# Patient Record
Sex: Female | Born: 1955
Health system: Southern US, Community
[De-identification: ages and names within clinical notes are randomized; demographics above are authoritative.]

## PROBLEM LIST (undated history)

## (undated) DIAGNOSIS — M810 Age-related osteoporosis without current pathological fracture: Secondary | ICD-10-CM

## (undated) DIAGNOSIS — E039 Hypothyroidism, unspecified: Secondary | ICD-10-CM

## (undated) DIAGNOSIS — B1920 Unspecified viral hepatitis C without hepatic coma: Secondary | ICD-10-CM

## (undated) DIAGNOSIS — L900 Lichen sclerosus et atrophicus: Secondary | ICD-10-CM

## (undated) DIAGNOSIS — R87619 Unspecified abnormal cytological findings in specimens from cervix uteri: Secondary | ICD-10-CM

## (undated) DIAGNOSIS — M25579 Pain in unspecified ankle and joints of unspecified foot: Secondary | ICD-10-CM

## (undated) DIAGNOSIS — O019 Hydatidiform mole, unspecified: Secondary | ICD-10-CM

## (undated) DIAGNOSIS — H9209 Otalgia, unspecified ear: Secondary | ICD-10-CM

## (undated) HISTORY — DX: Age-related osteoporosis without current pathological fracture: M81.0

## (undated) HISTORY — PX: DILATION AND CURETTAGE OF UTERUS: SHX78

## (undated) HISTORY — DX: Hydatidiform mole, unspecified: O01.9

## (undated) HISTORY — PX: EYE SURGERY: SHX253

## (undated) HISTORY — DX: Otalgia, unspecified ear: H92.09

## (undated) HISTORY — PX: TUBAL LIGATION: SHX77

## (undated) HISTORY — DX: Unspecified abnormal cytological findings in specimens from cervix uteri: R87.619

## (undated) HISTORY — DX: Unspecified viral hepatitis C without hepatic coma: B19.20

## (undated) HISTORY — DX: Pain in unspecified ankle and joints of unspecified foot: M25.579

## (undated) HISTORY — DX: Lichen sclerosus et atrophicus: L90.0

## (undated) HISTORY — DX: Hypothyroidism, unspecified: E03.9

## (undated) HISTORY — PX: CRYOTHERAPY: SHX1416

---
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
## 1998-09-11 DIAGNOSIS — E039 Hypothyroidism, unspecified: Secondary | ICD-10-CM | POA: Insufficient documentation

## 1999-05-23 ENCOUNTER — Other Ambulatory Visit: Admission: RE | Admit: 1999-05-23 | Discharge: 1999-05-23 | Payer: Self-pay | Admitting: Family Medicine

## 1999-12-09 ENCOUNTER — Other Ambulatory Visit: Admission: RE | Admit: 1999-12-09 | Discharge: 1999-12-09 | Payer: Self-pay

## 2001-01-30 ENCOUNTER — Other Ambulatory Visit: Admission: RE | Admit: 2001-01-30 | Discharge: 2001-01-30 | Payer: Self-pay | Admitting: Family Medicine

## 2003-09-12 DIAGNOSIS — M810 Age-related osteoporosis without current pathological fracture: Secondary | ICD-10-CM | POA: Insufficient documentation

## 2007-07-02 ENCOUNTER — Other Ambulatory Visit: Admission: RE | Admit: 2007-07-02 | Discharge: 2007-07-02 | Payer: Self-pay | Admitting: Obstetrics and Gynecology

## 2007-09-12 HISTORY — PX: COLONOSCOPY: SHX174

## 2007-09-25 ENCOUNTER — Ambulatory Visit (HOSPITAL_COMMUNITY): Admission: RE | Admit: 2007-09-25 | Discharge: 2007-09-25 | Payer: Self-pay | Admitting: Gastroenterology

## 2007-09-25 ENCOUNTER — Encounter (INDEPENDENT_AMBULATORY_CARE_PROVIDER_SITE_OTHER): Payer: Self-pay | Admitting: Gastroenterology

## 2008-01-30 ENCOUNTER — Ambulatory Visit: Payer: Self-pay | Admitting: Gastroenterology

## 2008-02-13 ENCOUNTER — Encounter (INDEPENDENT_AMBULATORY_CARE_PROVIDER_SITE_OTHER): Payer: Self-pay | Admitting: Interventional Radiology

## 2008-02-13 ENCOUNTER — Ambulatory Visit (HOSPITAL_COMMUNITY): Admission: RE | Admit: 2008-02-13 | Discharge: 2008-02-13 | Payer: Self-pay | Admitting: Gastroenterology

## 2008-07-16 ENCOUNTER — Other Ambulatory Visit: Admission: RE | Admit: 2008-07-16 | Discharge: 2008-07-16 | Payer: Self-pay | Admitting: Obstetrics and Gynecology

## 2010-01-07 ENCOUNTER — Ambulatory Visit (HOSPITAL_COMMUNITY): Admission: RE | Admit: 2010-01-07 | Discharge: 2010-01-07 | Payer: Self-pay | Admitting: Family Medicine

## 2010-03-30 ENCOUNTER — Ambulatory Visit: Payer: Self-pay | Admitting: Oncology

## 2010-04-05 ENCOUNTER — Ambulatory Visit (HOSPITAL_COMMUNITY): Admission: RE | Admit: 2010-04-05 | Discharge: 2010-04-05 | Payer: Self-pay | Admitting: Oncology

## 2010-04-05 LAB — CBC WITH DIFFERENTIAL/PLATELET
BASO%: 0.5 % (ref 0.0–2.0)
HCT: 38.7 % (ref 34.8–46.6)
LYMPH%: 27.7 % (ref 14.0–49.7)
MCHC: 34.7 g/dL (ref 31.5–36.0)
MCV: 88.2 fL (ref 79.5–101.0)
MONO#: 0.5 10*3/uL (ref 0.1–0.9)
MONO%: 7.5 % (ref 0.0–14.0)
NEUT%: 60 % (ref 38.4–76.8)
Platelets: 260 10*3/uL (ref 145–400)
RBC: 4.38 10*6/uL (ref 3.70–5.45)
WBC: 7 10*3/uL (ref 3.9–10.3)

## 2010-04-07 LAB — COMPREHENSIVE METABOLIC PANEL
Alkaline Phosphatase: 105 U/L (ref 39–117)
CO2: 24 mEq/L (ref 19–32)
Creatinine, Ser: 0.71 mg/dL (ref 0.40–1.20)
Glucose, Bld: 66 mg/dL — ABNORMAL LOW (ref 70–99)
Total Bilirubin: 0.7 mg/dL (ref 0.3–1.2)

## 2010-04-07 LAB — SPEP & IFE WITH QIG
Beta 2: 3.3 % (ref 3.2–6.5)
Beta Globulin: 5.3 % (ref 4.7–7.2)
Gamma Globulin: 22.2 % — ABNORMAL HIGH (ref 11.1–18.8)
IgA: 174 mg/dL (ref 68–378)
IgG (Immunoglobin G), Serum: 1960 mg/dL — ABNORMAL HIGH (ref 694–1618)
IgM, Serum: 148 mg/dL (ref 60–263)
M-Spike, %: 0.71 g/dL

## 2010-04-07 LAB — KAPPA/LAMBDA LIGHT CHAINS
Kappa free light chain: 1.88 mg/dL (ref 0.33–1.94)
Lambda Free Lght Chn: 2.33 mg/dL (ref 0.57–2.63)

## 2010-05-05 ENCOUNTER — Ambulatory Visit: Payer: Self-pay | Admitting: Gastroenterology

## 2010-05-25 ENCOUNTER — Ambulatory Visit (HOSPITAL_COMMUNITY): Admission: RE | Admit: 2010-05-25 | Discharge: 2010-05-25 | Payer: Self-pay | Admitting: Gastroenterology

## 2010-09-19 ENCOUNTER — Ambulatory Visit: Payer: Self-pay | Admitting: Oncology

## 2010-09-21 LAB — COMPREHENSIVE METABOLIC PANEL
ALT: 34 U/L (ref 0–35)
AST: 33 U/L (ref 0–37)
Albumin: 4.3 g/dL (ref 3.5–5.2)
Alkaline Phosphatase: 123 U/L — ABNORMAL HIGH (ref 39–117)
BUN: 19 mg/dL (ref 6–23)
CO2: 31 mEq/L (ref 19–32)
Calcium: 9.6 mg/dL (ref 8.4–10.5)
Chloride: 100 mEq/L (ref 96–112)
Creatinine, Ser: 1.05 mg/dL (ref 0.40–1.20)
Glucose, Bld: 86 mg/dL (ref 70–99)
Potassium: 3.6 mEq/L (ref 3.5–5.3)
Sodium: 140 mEq/L (ref 135–145)
Total Bilirubin: 0.8 mg/dL (ref 0.3–1.2)
Total Protein: 8 g/dL (ref 6.0–8.3)

## 2010-09-21 LAB — CBC WITH DIFFERENTIAL/PLATELET
BASO%: 0.7 % (ref 0.0–2.0)
Basophils Absolute: 0.1 10*3/uL (ref 0.0–0.1)
EOS%: 3.1 % (ref 0.0–7.0)
Eosinophils Absolute: 0.3 10*3/uL (ref 0.0–0.5)
HCT: 39.1 % (ref 34.8–46.6)
HGB: 13.6 g/dL (ref 11.6–15.9)
LYMPH%: 29.1 % (ref 14.0–49.7)
MCH: 30.6 pg (ref 25.1–34.0)
MCHC: 34.6 g/dL (ref 31.5–36.0)
MCV: 88.4 fL (ref 79.5–101.0)
MONO#: 0.6 10*3/uL (ref 0.1–0.9)
MONO%: 6.7 % (ref 0.0–14.0)
NEUT#: 5.5 10*3/uL (ref 1.5–6.5)
NEUT%: 60.4 % (ref 38.4–76.8)
Platelets: 320 10*3/uL (ref 145–400)
RBC: 4.42 10*6/uL (ref 3.70–5.45)
RDW: 12.9 % (ref 11.2–14.5)
WBC: 9.1 10*3/uL (ref 3.9–10.3)
lymph#: 2.7 10*3/uL (ref 0.9–3.3)

## 2010-09-23 LAB — IMMUNOFIXATION ELECTROPHORESIS
IgA: 183 mg/dL (ref 68–378)
IgG (Immunoglobin G), Serum: 2030 mg/dL — ABNORMAL HIGH (ref 694–1618)
IgM, Serum: 153 mg/dL (ref 60–263)
Total Protein, Serum Electrophoresis: 8.1 g/dL (ref 6.0–8.3)

## 2010-09-23 LAB — KAPPA/LAMBDA LIGHT CHAINS
Kappa free light chain: 2.2 mg/dL — ABNORMAL HIGH (ref 0.33–1.94)
Kappa:Lambda Ratio: 1.38 (ref 0.26–1.65)
Lambda Free Lght Chn: 1.59 mg/dL (ref 0.57–2.63)

## 2010-10-02 ENCOUNTER — Encounter: Payer: Self-pay | Admitting: Physical Therapy

## 2011-01-24 NOTE — Op Note (Signed)
Brianna Greene, Brianna Greene               ACCOUNT NO.:  [id number]   MEDICAL RECORD NO.:  [id number]          PATIENT TYPE:  AMB   LOCATION:  ENDO                         FACILITY:  [facility]   PHYSICIAN:  Anselmo Rod, M.D.  DATE OF BIRTH:  12/01/1955   DATE OF PROCEDURE:  09/25/2007  DATE OF DISCHARGE:                               OPERATIVE REPORT   PROCEDURE PERFORMED:  Colonoscopy with cold biopsies x 1.   ENDOSCOPIST:  Dr. Anselmo Rod.   INSTRUMENT USED:  Pentax video colonoscope.   INDICATIONS FOR PROCEDURE:  A 55 year old white female with a history of  Hepatitis C followed by Dr. Sharon Mt in the past, at [facility], undergoing a screening colonoscopy to rule out colonic polyps,  masses, etc. The patient has a history of occasional rectal bleeding.  Her mother has a history of ulcerative colitis.  The patient denies a  family history of colon cancer.   PREPROCEDURE PREPARATION:  Informed consent was procured from the  patient.  The patient fasted for 4 hours prior to the procedure after  being prepped with will MoviPrep the night of and the morning of the  procedure. The risks and benefits of the procedure including a 10%  missed rate of cancer and polyp were discussed with the patient as well.   PREPROCEDURE PHYSICAL:  The patient had stable vital signs.  Neck  supple.  Chest clear to auscultation.  S1, S2 regular.  Abdomen soft  with normal bowel sounds.   DESCRIPTION OF PROCEDURE:  The patient was placed in the left lateral  decubitus position and sedated with 75 mcg of fentanyl and 6 mg of  Versed given intravenously in slow incremental doses.  Once the patient  was adequately sedated and maintained on low-flow oxygen and continuous  cardiac monitoring, the Pentax video colonoscope was advanced from the  rectum to the cecum. The patient had a somewhat redundant colon.  A  small sessile polyp was biopsied from the mid right colon.  The  appendiceal orifice  and ileocecal valve were clearly visualized. After  gentle abdominal pressure was applied, the patient's position was  changed from the left lateral to the supine position to facilitate the  intubation of the cecum.  The terminal ileum appeared healthy and  without lesions.  Retroflexion of the rectum revealed small internal  hemorrhoids.  Small external hemorrhoids were seen on anal inspection.  The patient tolerated the procedure well and without immediate  complications.   IMPRESSION:  1. Small external hemorrhoids.  2. Small internal hemorrhoids.  3. No evidence of diverticulosis.  4. Small sessile polyp biopsied from the mid right colon (cold biopsy      x 1).  5. Normal terminal ileum.   RECOMMENDATIONS:  1. Await pathology results.  2. Repeat colonoscopy depending on pathology results.  3. Avoid all nonsteroidals including aspirin for the next 2 weeks.  4. Continue high fiber diet with liberal fluid intake.  5. Outpatient follow-up as need arises in the future.      Anselmo Rod, M.D.  Electronically Signed  JNM/MEDQ  D:  09/25/2007  T:  09/25/2007  Job:  [id number]   cc:   M. Leda Quail, MD  Fax: [phone]

## 2011-03-29 ENCOUNTER — Encounter (HOSPITAL_BASED_OUTPATIENT_CLINIC_OR_DEPARTMENT_OTHER): Payer: 59 | Admitting: Oncology

## 2011-03-29 ENCOUNTER — Other Ambulatory Visit: Payer: Self-pay | Admitting: Oncology

## 2011-03-29 DIAGNOSIS — M810 Age-related osteoporosis without current pathological fracture: Secondary | ICD-10-CM

## 2011-03-29 LAB — CBC WITH DIFFERENTIAL/PLATELET
BASO%: 0.6 % (ref 0.0–2.0)
Basophils Absolute: 0 10*3/uL (ref 0.0–0.1)
EOS%: 3.6 % (ref 0.0–7.0)
MCH: 30.7 pg (ref 25.1–34.0)
MCHC: 34.5 g/dL (ref 31.5–36.0)
MCV: 88.9 fL (ref 79.5–101.0)
MONO%: 7.5 % (ref 0.0–14.0)
RBC: 4.1 10*6/uL (ref 3.70–5.45)
RDW: 13.1 % (ref 11.2–14.5)

## 2011-04-03 LAB — SPEP & IFE WITH QIG
Alpha-1-Globulin: 4.4 % (ref 2.9–4.9)
Gamma Globulin: 22.6 % — ABNORMAL HIGH (ref 11.1–18.8)
IgM, Serum: 127 mg/dL (ref 52–322)
M-Spike, %: 0.66 g/dL

## 2011-04-03 LAB — COMPREHENSIVE METABOLIC PANEL
AST: 26 U/L (ref 0–37)
Albumin: 4 g/dL (ref 3.5–5.2)
Alkaline Phosphatase: 159 U/L — ABNORMAL HIGH (ref 39–117)
BUN: 17 mg/dL (ref 6–23)
Potassium: 4 mEq/L (ref 3.5–5.3)
Sodium: 142 mEq/L (ref 135–145)

## 2011-04-03 LAB — KAPPA/LAMBDA LIGHT CHAINS: Kappa:Lambda Ratio: 1.69 — ABNORMAL HIGH (ref 0.26–1.65)

## 2011-04-21 ENCOUNTER — Encounter (HOSPITAL_BASED_OUTPATIENT_CLINIC_OR_DEPARTMENT_OTHER): Payer: 59 | Admitting: Oncology

## 2011-04-21 DIAGNOSIS — M810 Age-related osteoporosis without current pathological fracture: Secondary | ICD-10-CM

## 2011-04-21 DIAGNOSIS — D472 Monoclonal gammopathy: Secondary | ICD-10-CM

## 2011-06-08 LAB — CBC
HCT: 39.2
Platelets: 263
WBC: 7.4

## 2011-06-08 LAB — PROTIME-INR
INR: 0.9
Prothrombin Time: 12.6

## 2011-09-27 ENCOUNTER — Other Ambulatory Visit (HOSPITAL_COMMUNITY): Payer: Self-pay | Admitting: Otolaryngology

## 2011-09-27 DIAGNOSIS — R591 Generalized enlarged lymph nodes: Secondary | ICD-10-CM

## 2011-09-27 DIAGNOSIS — R42 Dizziness and giddiness: Secondary | ICD-10-CM

## 2011-10-05 ENCOUNTER — Other Ambulatory Visit (HOSPITAL_COMMUNITY): Payer: Self-pay | Admitting: Otolaryngology

## 2011-10-05 DIAGNOSIS — R42 Dizziness and giddiness: Secondary | ICD-10-CM

## 2011-10-05 DIAGNOSIS — R591 Generalized enlarged lymph nodes: Secondary | ICD-10-CM

## 2011-10-10 ENCOUNTER — Other Ambulatory Visit (HOSPITAL_COMMUNITY): Payer: Self-pay | Admitting: Otolaryngology

## 2011-10-18 ENCOUNTER — Ambulatory Visit (HOSPITAL_COMMUNITY)
Admission: RE | Admit: 2011-10-18 | Discharge: 2011-10-18 | Disposition: A | Discharge status: Home / Self Care | Payer: 59 | Source: Ambulatory Visit | Attending: Otolaryngology | Admitting: Otolaryngology

## 2011-10-18 DIAGNOSIS — R42 Dizziness and giddiness: Secondary | ICD-10-CM | POA: Insufficient documentation

## 2011-10-18 DIAGNOSIS — R599 Enlarged lymph nodes, unspecified: Secondary | ICD-10-CM | POA: Insufficient documentation

## 2011-10-18 DIAGNOSIS — R591 Generalized enlarged lymph nodes: Secondary | ICD-10-CM

## 2011-10-18 NOTE — Progress Notes (Signed)
Bilateral carotid artery duplex completed.  Preliminary report is no evidence of significant ICA stenosis. 

## 2011-10-21 ENCOUNTER — Telehealth: Payer: Self-pay | Admitting: Oncology

## 2011-10-21 NOTE — Telephone Encounter (Signed)
Called pt, left message aapt on 04/12/12 lab and md visit on 04/19/12

## 2012-02-02 ENCOUNTER — Ambulatory Visit (INDEPENDENT_AMBULATORY_CARE_PROVIDER_SITE_OTHER): Payer: 59 | Admitting: Pharmacist

## 2012-02-02 VITALS — BP 111/77 | HR 63 | Ht 62.6 in | Wt 136.0 lb

## 2012-02-02 DIAGNOSIS — B1920 Unspecified viral hepatitis C without hepatic coma: Secondary | ICD-10-CM

## 2012-02-02 DIAGNOSIS — E039 Hypothyroidism, unspecified: Secondary | ICD-10-CM

## 2012-02-02 DIAGNOSIS — M810 Age-related osteoporosis without current pathological fracture: Secondary | ICD-10-CM

## 2012-02-02 NOTE — Progress Notes (Signed)
Patient ID: Brianna Greene, female   DOB: [birth year], 56 y.o.   MRN: [id number] Reviewed and agree with Dr. Macky Lower management.

## 2012-02-02 NOTE — Assessment & Plan Note (Signed)
Following medication review, no suggestions for change.  Discussed future osteoporosis treatments.  Complete medication list provided to patient.  Total time in face to face medication review: 40 minutes.  Patient seen with: Melynda Ripple, PharmD. Candidate

## 2012-02-02 NOTE — Patient Instructions (Signed)
Thanks for coming in today for medication review.   This visit with be communicated to the [facility] and you will be eligible for medication coverage.

## 2012-02-02 NOTE — Progress Notes (Signed)
  Subjective:    Patient ID: Brianna Greene, female    DOB: 04/15/1956, 56 y.o.   MRN: [id number]  HPI  Patient arrives in good spirits for medication review.  Reports seeing Tresa Moore as primary care provider,  Debbie Leonard/ Corrie Mckusick - [facility] and  [facility].   Reports being diagnosed with Osteoporosis since 2005 and is completing second year of forteo therapy.   She is trying to increase activity to improve bone strength.     We discussed appropriate Calcium and Vitamin D goals of therapy.    Review of Systems     Objective:   Physical Exam        Assessment & Plan:  Following medication review, no suggestions for change.  Discussed options for Hep C treatment AND future osteoporosis treatments.  Complete medication list provided to patient.  Total time in face to face medication review: 40 minutes.  Patient seen with: Melynda Ripple, PharmD. Candidate

## 2012-04-12 ENCOUNTER — Other Ambulatory Visit (HOSPITAL_BASED_OUTPATIENT_CLINIC_OR_DEPARTMENT_OTHER): Payer: 59 | Admitting: Lab

## 2012-04-12 DIAGNOSIS — M810 Age-related osteoporosis without current pathological fracture: Secondary | ICD-10-CM

## 2012-04-12 DIAGNOSIS — D472 Monoclonal gammopathy: Secondary | ICD-10-CM

## 2012-04-12 LAB — CBC WITH DIFFERENTIAL/PLATELET
Basophils Absolute: 0.2 10*3/uL — ABNORMAL HIGH (ref 0.0–0.1)
EOS%: 4.3 % (ref 0.0–7.0)
LYMPH%: 30.9 % (ref 14.0–49.7)
MCH: 30.3 pg (ref 25.1–34.0)
MCV: 88.9 fL (ref 79.5–101.0)
MONO%: 6.9 % (ref 0.0–14.0)
RBC: 4.26 10*6/uL (ref 3.70–5.45)
RDW: 13.4 % (ref 11.2–14.5)

## 2012-04-16 LAB — SPEP & IFE WITH QIG
Beta 2: 2.9 % — ABNORMAL LOW (ref 3.2–6.5)
Beta Globulin: 5.7 % (ref 4.7–7.2)
IgA: 167 mg/dL (ref 69–380)
IgG (Immunoglobin G), Serum: 1660 mg/dL (ref 690–1700)
M-Spike, %: 0.51 g/dL

## 2012-04-16 LAB — COMPREHENSIVE METABOLIC PANEL
AST: 27 U/L (ref 0–37)
Albumin: 4.1 g/dL (ref 3.5–5.2)
Alkaline Phosphatase: 124 U/L — ABNORMAL HIGH (ref 39–117)
BUN: 18 mg/dL (ref 6–23)
Potassium: 4 mEq/L (ref 3.5–5.3)
Sodium: 138 mEq/L (ref 135–145)
Total Bilirubin: 0.6 mg/dL (ref 0.3–1.2)

## 2012-04-16 LAB — KAPPA/LAMBDA LIGHT CHAINS: Kappa:Lambda Ratio: 1.36 (ref 0.26–1.65)

## 2012-04-19 ENCOUNTER — Ambulatory Visit (HOSPITAL_BASED_OUTPATIENT_CLINIC_OR_DEPARTMENT_OTHER): Payer: 59 | Admitting: Oncology

## 2012-04-19 VITALS — BP 117/74 | HR 63 | Temp 97.2°F | Resp 18 | Wt 135.4 lb

## 2012-04-19 DIAGNOSIS — M810 Age-related osteoporosis without current pathological fracture: Secondary | ICD-10-CM

## 2012-04-19 DIAGNOSIS — D472 Monoclonal gammopathy: Secondary | ICD-10-CM

## 2012-04-19 NOTE — Progress Notes (Signed)
Hematology and Oncology Follow Up Visit  Brianna Greene [id number] Jan 03, 1956 56 y.o. 04/19/2012 9:50 AM   Principle Diagnosis: This is a 56 year old female with monoclonal gammopathy, IgA kappa subtype, M spike around 0.7 gm/dL, IgG level is 4098, differential diagnosis monoclonal gammopathy of undetermined significance versus reactive.  Current therapy: Observation and follow up.   Interim History:  Brianna Greene presents today for a followup visit.  A very pleasant 56 year old woman whom I have been monitoring for monoclonal protein without any evidence to suggest end-organ damage or any suggestive myeloma at this point.  Since the last time I saw her, she has been on Forteo for osteoporosis and had been doing relatively well on it.  Had not had any complications.  Had not had any illnesses or hospitalization.  Did not have any pathological bone fractures.  Had not had any falls.  Had not had any major changes in her performance status or activity level.  Had not had any opportunistic infections.  Her overall performance status and activity level remain stable.  Medications: I have reviewed the patient's current medications. Current outpatient prescriptions:Cholecalciferol (VITAMIN D3) 50000 UNITS CAPS, Take 1 capsule by mouth every 30 (thirty) days. Chewable wafer "Replesta", Disp: , Rfl: ;  Calcium Lactate 750 MG TABS, Take 1 tablet (750 mg total) by mouth 2 (two) times daily. Also takes Bone Strength, - Calcium from Algae Sourc - contains some vitamin D, Disp: , Rfl: 0 magnesium oxide (MAG-OX 400) 400 MG tablet, Take 1 tablet (400 mg total) by mouth daily., Disp: , Rfl: ;  Teriparatide, Recombinant, (FORTEO) 600 MCG/2.4ML SOLN, Inject into the skin., Disp: 2.4 mL, Rfl: ;  thyroid (ARMOUR THYROID) 60 MG tablet, Take 1 tablet (60 mg total) by mouth daily., Disp: , Rfl:   Allergies: No Known Allergies  Past Medical History, Surgical history, Social history, and Family History were reviewed and  updated.  Review of Systems: Constitutional:  Negative for fever, chills, night sweats, anorexia, weight loss, pain. Cardiovascular: no chest pain or dyspnea on exertion Respiratory: negative Neurological: negative Dermatological: negative ENT: negative Skin: Negative. Gastrointestinal: positive for - abdominal pain Genito-Urinary: negative Hematological and Lymphatic: negative Breast: negative Musculoskeletal: negative Remaining ROS negative. Physical Exam: Blood pressure 117/74, pulse 63, temperature 97.2 F (36.2 C), temperature source Oral, resp. rate 18, weight 135 lb 6.4 oz (61.417 kg). ECOG: 0General appearance: alert Head: Normocephalic, without obvious abnormality, atraumatic Neck: no adenopathy, no carotid bruit, no JVD, supple, symmetrical, trachea midline and thyroid not enlarged, symmetric, no tenderness/mass/nodules Lymph nodes: Cervical, supraclavicular, and axillary nodes normal. Heart:regular rate and rhythm, S1, S2 normal, no murmur, click, rub or gallop Lung:chest clear, no wheezing, rales, normal symmetric air entry Abdomin: soft, non-tender, without masses or organomegaly EXT:no erythema, induration, or nodules   Lab Results: Lab Results  Component Value Date   WBC 7.9 04/12/2012   HGB 12.9 04/12/2012   HCT 37.9 04/12/2012   MCV 88.9 04/12/2012   PLT 262 04/12/2012     Chemistry      Component Value Date/Time   NA 138 04/12/2012 1315   K 4.0 04/12/2012 1315   CL 104 04/12/2012 1315   CO2 24 04/12/2012 1315   BUN 18 04/12/2012 1315   CREATININE 0.79 04/12/2012 1315      Component Value Date/Time   CALCIUM 9.2 04/12/2012 1315   ALKPHOS 124* 04/12/2012 1315   AST 27 04/12/2012 1315   ALT 30 04/12/2012 1315   BILITOT 0.6 04/12/2012 1315  Results for Brianna Greene (MRN [id number]) as of 04/19/2012 09:36  Ref. Range 04/12/2012 13:15  Kappa free light chain Latest Range: 0.33-1.94 mg/dL 4.09 (H)  Kappa:Lambda Ratio Latest Range: 0.26-1.65  1.36  Lambda Free Lght Chn Latest  Range: 0.57-2.63 mg/dL 8.11  Albumin ELP Latest Range: 55.8-66.1 % 56.6  COMMENT (PROTEIN ELECTROPHOR) No range found *  Alpha-1-Globulin Latest Range: 2.9-4.9 % 4.2  Alpha-2-Globulin Latest Range: 7.1-11.8 % 8.9  Beta Globulin Latest Range: 4.7-7.2 % 5.7  Beta 2 Latest Range: 3.2-6.5 % 2.9 (L)  Gamma Globulin Latest Range: 11.1-18.8 % 21.7 (H)  M-SPIKE, % No range found 0.51  SPE Interp. No range found *  IgG (Immunoglobin G), Serum Latest Range: 531-337-1976 mg/dL 9147  IgA Latest Range: 69-380 mg/dL 829  IgM, Serum Latest Range: 52-322 mg/dL 562  Total Protein, serum electrophor Latest Range: 6.0-8.3 g/dL 7.0    Impression and Plan: This is a pleasant 56 year old female with the following issues: 1. IgA kappa monoclonal protein although her IgG level is elevated, it is the IgA kappa by immunofixation.  Overall I feel this is either reactive versus a possible monoclonal gammopathy of undetermined significance.  Her M spike is down and her IgA and IgG are normal.  At this poin, she has no findings to suggest multiple myeloma so for the time being, continued observation is the way to go. I recommend repeating SPEP and quantitative  immunoglobulins on and annual bases. She expressed interest for this done at her PCP and I told that it will be fine by me and I will be happy to see her in the future as needed.  2. Osteopetrosis: I do not think this is related to a plasma cell disorder.   Brianna Eskelson, MD 8/9/20139:50 AM

## 2012-10-02 ENCOUNTER — Other Ambulatory Visit (HOSPITAL_COMMUNITY): Payer: Self-pay | Admitting: Sports Medicine

## 2012-10-02 ENCOUNTER — Other Ambulatory Visit (HOSPITAL_COMMUNITY): Payer: Self-pay | Admitting: Endocrinology

## 2012-10-02 ENCOUNTER — Ambulatory Visit (HOSPITAL_COMMUNITY)
Admission: RE | Admit: 2012-10-02 | Discharge: 2012-10-02 | Disposition: A | Discharge status: Home / Self Care | Payer: 59 | Source: Ambulatory Visit | Attending: Endocrinology | Admitting: Endocrinology

## 2012-10-02 DIAGNOSIS — R1904 Left lower quadrant abdominal swelling, mass and lump: Secondary | ICD-10-CM

## 2012-10-02 DIAGNOSIS — E049 Nontoxic goiter, unspecified: Secondary | ICD-10-CM

## 2012-10-02 DIAGNOSIS — Z0389 Encounter for observation for other suspected diseases and conditions ruled out: Secondary | ICD-10-CM | POA: Insufficient documentation

## 2012-10-03 ENCOUNTER — Ambulatory Visit (HOSPITAL_COMMUNITY)
Admission: RE | Admit: 2012-10-03 | Discharge: 2012-10-03 | Disposition: A | Discharge status: Home / Self Care | Payer: 59 | Source: Ambulatory Visit | Attending: Sports Medicine | Admitting: Sports Medicine

## 2012-10-03 DIAGNOSIS — R1904 Left lower quadrant abdominal swelling, mass and lump: Secondary | ICD-10-CM | POA: Insufficient documentation

## 2012-10-03 DIAGNOSIS — R109 Unspecified abdominal pain: Secondary | ICD-10-CM | POA: Insufficient documentation

## 2012-10-03 DIAGNOSIS — N949 Unspecified condition associated with female genital organs and menstrual cycle: Secondary | ICD-10-CM | POA: Insufficient documentation

## 2012-10-03 DIAGNOSIS — Q619 Cystic kidney disease, unspecified: Secondary | ICD-10-CM | POA: Insufficient documentation

## 2012-10-03 MED ORDER — IOHEXOL 300 MG/ML  SOLN
25.0000 mL | INTRAMUSCULAR | Status: AC
  Administered 2012-10-03 (×2): 25 mL via ORAL

## 2012-10-03 MED ORDER — IOHEXOL 300 MG/ML  SOLN
80.0000 mL | Freq: Once | INTRAMUSCULAR | Status: AC | PRN
  Administered 2012-10-03: 80 mL via INTRAVENOUS

## 2012-10-26 ENCOUNTER — Other Ambulatory Visit: Payer: Self-pay

## 2013-01-10 ENCOUNTER — Other Ambulatory Visit: Payer: Self-pay | Admitting: Certified Nurse Midwife

## 2013-01-10 NOTE — Telephone Encounter (Signed)
Pt had aex on 09/26/2012. This Rx not given at aex. Armour 60mg   #30/ 1 refill approved on 10/29/2012. Do you prefer pt's PCP to handle this Rx?   Please advise. Chart on your desk.

## 2013-01-10 NOTE — Telephone Encounter (Signed)
Patient seeing Dr. Talmage Nap who is managing  Deny refill

## 2013-01-13 NOTE — Telephone Encounter (Signed)
RX denied per [facility] request.

## 2013-01-15 ENCOUNTER — Telehealth: Payer: Self-pay | Admitting: Certified Nurse Midwife

## 2013-01-15 ENCOUNTER — Other Ambulatory Visit: Payer: Self-pay | Admitting: Certified Nurse Midwife

## 2013-01-15 NOTE — Telephone Encounter (Signed)
Pt. Is requesting  Refill status, patient says pharmacy sent in request yesterday

## 2013-01-15 NOTE — Telephone Encounter (Signed)
Pt is not followed by this office for this problem.  Denied per 01/10/13 refill encounter.

## 2013-01-15 NOTE — Telephone Encounter (Signed)
Patient notified of need to contact Dr. Talmage Nap office for refill on thyroid Armour medication. sue

## 2013-03-05 ENCOUNTER — Encounter: Payer: Self-pay | Admitting: Family Medicine

## 2013-03-05 ENCOUNTER — Ambulatory Visit (INDEPENDENT_AMBULATORY_CARE_PROVIDER_SITE_OTHER): Payer: 59 | Admitting: Family Medicine

## 2013-03-05 ENCOUNTER — Ambulatory Visit (HOSPITAL_BASED_OUTPATIENT_CLINIC_OR_DEPARTMENT_OTHER)
Admission: RE | Admit: 2013-03-05 | Discharge: 2013-03-05 | Disposition: A | Discharge status: Home / Self Care | Payer: 59 | Source: Ambulatory Visit | Attending: Family Medicine | Admitting: Family Medicine

## 2013-03-05 VITALS — BP 120/74 | HR 61 | Ht 62.0 in | Wt 134.0 lb

## 2013-03-05 DIAGNOSIS — M25552 Pain in left hip: Secondary | ICD-10-CM

## 2013-03-05 DIAGNOSIS — R059 Cough, unspecified: Secondary | ICD-10-CM

## 2013-03-05 DIAGNOSIS — N644 Mastodynia: Secondary | ICD-10-CM

## 2013-03-05 DIAGNOSIS — M25559 Pain in unspecified hip: Secondary | ICD-10-CM

## 2013-03-05 DIAGNOSIS — B1920 Unspecified viral hepatitis C without hepatic coma: Secondary | ICD-10-CM

## 2013-03-05 DIAGNOSIS — M810 Age-related osteoporosis without current pathological fracture: Secondary | ICD-10-CM | POA: Insufficient documentation

## 2013-03-05 DIAGNOSIS — G8929 Other chronic pain: Secondary | ICD-10-CM

## 2013-03-05 DIAGNOSIS — R05 Cough: Secondary | ICD-10-CM

## 2013-03-05 MED ORDER — PANTOPRAZOLE SODIUM 40 MG PO TBEC: 40.0000 mg | DELAYED_RELEASE_TABLET | Freq: Every day | ORAL | Status: DC

## 2013-03-05 MED ORDER — CETIRIZINE HCL 10 MG PO TABS: 10.0000 mg | ORAL_TABLET | Freq: Every day | ORAL | Status: DC

## 2013-03-05 NOTE — Progress Notes (Signed)
Subjective:    Patient ID: Brianna Greene, female    DOB: [birth year], 57 y.o.   MRN: [id number]  HPI  Brianna Greene is here today to re-establish with our practice.  Brianna Greene was last seen at our previous office in 12/2009.  Brianna Greene would like to discuss a few issues.   1)  Hepatitis C:  Brianna Greene had an liver U/S in 2011 which was normal.  Brianna Greene would like to be referred [facility] for management of Brianna Greene disease.    2)  Cough:  Brianna Greene has struggled with non productive cough off and on for several weeks.  Brianna Greene was recently seen at [facility] and Brianna Greene was told that it may be GERD and was told to take an OTC PPI.  Brianna Greene was also instructed to follow up with Brianna Greene PCP.    3)  Lump in Chest:  Brianna Greene noticed a small lump above Brianna Greene right breast about a month ago that was mildly tender.  Brianna Greene no longer feels the lump.     4)  Left Hip Pain:  Brianna Greene has been having some pain in Brianna Greene left hip.    Review of Systems  Constitutional: Negative for fatigue and unexpected weight change.  HENT: Negative.   Eyes: Negative.   Respiratory: Positive for cough.   Cardiovascular: Negative for chest pain, palpitations and leg swelling.  Gastrointestinal: Negative.   Genitourinary: Negative.   Musculoskeletal: Positive for arthralgias (Left Hip Pain ).  Neurological: Negative.   Hematological: Negative.   Psychiatric/Behavioral: Negative.    Past Medical History  Diagnosis Date  . Hypothyroidism   . Hepatitis C   . Hydatidiform mole   . Osteoporosis   . Pain in joint, ankle and foot   . Otalgia, unspecified    Family History  Problem Relation Age of Onset  . Ulcerative colitis Mother   . Arthritis Mother   . COPD Mother   . Hyperlipidemia Mother   . Alcohol abuse Father   . Liver disease Father   . Diverticulitis Father   . Heart disease Maternal Grandmother   . 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 No distress.  HENT:  Head: Normocephalic.  Eyes: No scleral icterus.  Neck: No thyromegaly present.  Cardiovascular: Normal rate, regular rhythm and normal heart sounds.   Pulmonary/Chest: Effort normal and breath sounds normal. Brianna Greene exhibits no mass and no tenderness. Right breast exhibits no inverted nipple, no mass, no nipple discharge, no skin change and no tenderness. Left breast exhibits no inverted nipple, no mass, no nipple discharge, no skin change and no tenderness.  Abdominal: There is no tenderness.  Musculoskeletal: Brianna Greene exhibits no edema and no tenderness.  Neurological: Brianna Greene is alert.  Skin: Skin is warm and dry.  Psychiatric: Brianna Greene has a normal mood and affect. Brianna Greene behavior is normal.  Judgment and thought content normal.          Assessment & Plan:

## 2013-03-05 NOTE — Patient Instructions (Addendum)
1)  X-ray of Left Hip  2)  Refer to [facility]   3)  Zyrtec vs Protonix vs Umcka Cold Care   4)  Breast Discomfort - Vitamin E 400-800 IU and/or Evening Primrose Oil     Cough, Adult  A cough is a reflex that helps clear your throat and airways. It can help heal the body or may be a reaction to an irritated airway. A cough may only last 2 or 3 weeks (acute) or may last more than 8 weeks (chronic).  CAUSES Acute cough:  Viral or bacterial infections. Chronic cough:  Infections.  Allergies.  Asthma.  Post-nasal drip.  Smoking.  Heartburn or acid reflux.  Some medicines.  Chronic lung problems (COPD).  Cancer. SYMPTOMS   Cough.  Fever.  Chest pain.  Increased breathing rate.  High-pitched whistling sound when breathing (wheezing).  Colored mucus that you cough up (sputum). TREATMENT   A bacterial cough may be treated with antibiotic medicine.  A viral cough must run its course and will not respond to antibiotics.  Your caregiver may recommend other treatments if you have a chronic cough. HOME CARE INSTRUCTIONS   Only take over-the-counter or prescription medicines for pain, discomfort, or fever as directed by your caregiver. Use cough suppressants only as directed by your caregiver.  Use a cold steam vaporizer or humidifier in your bedroom or home to help loosen secretions.  Sleep in a semi-upright position if your cough is worse at night.  Rest as needed.  Stop smoking if you smoke. SEEK IMMEDIATE MEDICAL CARE IF:   You have pus in your sputum.  Your cough starts to worsen.  You cannot control your cough with suppressants and are losing sleep.  You begin coughing up blood.  You have difficulty breathing.  You develop pain which is getting worse or is uncontrolled with medicine.  You have a fever. MAKE SURE YOU:   Understand these instructions.  Will watch your condition.  Will get help right away if you are not doing well or  get worse. Document Released: 02/24/2011 Document Revised: 11/20/2011 Document Reviewed: 02/24/2011 [facility] Patient Information 2014 [place], [place].

## 2013-03-13 ENCOUNTER — Encounter: Payer: Self-pay | Admitting: *Deleted

## 2013-04-16 ENCOUNTER — Encounter: Payer: Self-pay | Admitting: Family Medicine

## 2013-04-16 DIAGNOSIS — R059 Cough, unspecified: Secondary | ICD-10-CM | POA: Insufficient documentation

## 2013-04-16 DIAGNOSIS — R05 Cough: Secondary | ICD-10-CM | POA: Insufficient documentation

## 2013-04-16 DIAGNOSIS — N644 Mastodynia: Secondary | ICD-10-CM | POA: Insufficient documentation

## 2013-04-16 DIAGNOSIS — G8929 Other chronic pain: Secondary | ICD-10-CM | POA: Insufficient documentation

## 2013-04-16 NOTE — Assessment & Plan Note (Signed)
We discussed various reasons why patients cough and the different things she can try to improve the cough.

## 2013-04-16 NOTE — Assessment & Plan Note (Signed)
Newman Nip for an x-ray of her left hip which did not show any abnormality.

## 2013-04-16 NOTE — Assessment & Plan Note (Addendum)
We are referring her to [facility].

## 2013-04-16 NOTE — Assessment & Plan Note (Signed)
The lump she felt seems to have resolved.  She is to try Vitamin E and Evening Primrose Oil for discomfort.

## 2013-07-15 ENCOUNTER — Other Ambulatory Visit (HOSPITAL_COMMUNITY): Payer: Self-pay | Admitting: Internal Medicine

## 2013-07-15 DIAGNOSIS — B182 Chronic viral hepatitis C: Secondary | ICD-10-CM

## 2013-07-17 ENCOUNTER — Other Ambulatory Visit: Payer: Self-pay

## 2013-07-21 ENCOUNTER — Ambulatory Visit (HOSPITAL_COMMUNITY)
Admission: RE | Admit: 2013-07-21 | Discharge: 2013-07-21 | Disposition: A | Discharge status: Home / Self Care | Payer: 59 | Source: Ambulatory Visit | Attending: Internal Medicine | Admitting: Internal Medicine

## 2013-07-21 DIAGNOSIS — B182 Chronic viral hepatitis C: Secondary | ICD-10-CM | POA: Insufficient documentation

## 2013-09-18 ENCOUNTER — Telehealth: Payer: Self-pay | Admitting: Certified Nurse Midwife

## 2013-09-18 ENCOUNTER — Other Ambulatory Visit: Payer: Self-pay | Admitting: Certified Nurse Midwife

## 2013-09-18 DIAGNOSIS — Z0000 Encounter for general adult medical examination without abnormal findings: Secondary | ICD-10-CM

## 2013-09-18 NOTE — Telephone Encounter (Signed)
Patient is calling to see if Debbi could put an order in to have her labs done before her appt so that they could go over the results.

## 2013-09-18 NOTE — Telephone Encounter (Signed)
Pt wanting to have lab work done or ordered in [employer] for [employer] to have results back to discuss On Annual Exam 09/30/13 TSH  Vitamin D  Cholesterol   And  Check  Hep B immunity.

## 2013-09-22 NOTE — Telephone Encounter (Signed)
Labs are ordered and released. Patient notified and will have labs drawn at [facility] lab.

## 2013-09-26 ENCOUNTER — Other Ambulatory Visit: Payer: Self-pay | Admitting: Certified Nurse Midwife

## 2013-09-27 LAB — HEPATITIS B SURFACE ANTIGEN: Hepatitis B Surface Ag: NEGATIVE

## 2013-09-27 LAB — LIPID PANEL
CHOL/HDL RATIO: 2.1 ratio
CHOLESTEROL: 173 mg/dL (ref 0–200)
HDL: 82 mg/dL (ref >39)
LDL Cholesterol: 83 mg/dL (ref 0–99)
TRIGLYCERIDES: 40 mg/dL (ref <150)
VLDL: 8 mg/dL (ref 0–40)

## 2013-09-27 LAB — TSH: TSH: 0.502 u[IU]/mL (ref 0.350–4.500)

## 2013-09-27 LAB — VITAMIN D 25 HYDROXY (VIT D DEFICIENCY, FRACTURES): Vit D, 25-Hydroxy: 70 ng/mL (ref 30–89)

## 2013-09-29 ENCOUNTER — Encounter: Payer: Self-pay | Admitting: Certified Nurse Midwife

## 2013-09-30 ENCOUNTER — Encounter: Payer: Self-pay | Admitting: Certified Nurse Midwife

## 2013-09-30 ENCOUNTER — Ambulatory Visit (INDEPENDENT_AMBULATORY_CARE_PROVIDER_SITE_OTHER): Payer: 59 | Admitting: Certified Nurse Midwife

## 2013-09-30 VITALS — BP 102/64 | HR 68 | Resp 16 | Ht 62.25 in | Wt 134.0 lb

## 2013-09-30 DIAGNOSIS — Z0000 Encounter for general adult medical examination without abnormal findings: Secondary | ICD-10-CM

## 2013-09-30 DIAGNOSIS — Z01419 Encounter for gynecological examination (general) (routine) without abnormal findings: Secondary | ICD-10-CM

## 2013-09-30 LAB — POCT URINALYSIS DIPSTICK
BILIRUBIN UA: NEGATIVE
GLUCOSE UA: NEGATIVE
KETONES UA: NEGATIVE
LEUKOCYTES UA: NEGATIVE
Nitrite, UA: NEGATIVE
PH UA: 8
Protein, UA: NEGATIVE
RBC UA: NEGATIVE
Urobilinogen, UA: NEGATIVE

## 2013-09-30 NOTE — Patient Instructions (Signed)

## 2013-09-30 NOTE — Progress Notes (Signed)
58 y.o. [id number] Married Caucasian Fe here for annual exam.Menopausal no HRT. Denies vaginal bleeding or vaginal dryness change. Seeing endocrine for Hypothyroid/ostoporosis management and PCP for aex, labs yearly. Patient took Reclast last year for osteoporosis management. Patient is having work up for treatment of Chronic Hep.C and will decide if candidate. No health issues today. Planning a trip to [place] in spring!   Patient's last menstrual period was 09/11/2005.          Sexually active: yes  The current method of family planning is tubal ligation.    Exercising: no  exercise Smoker:  no  Health Maintenance: Pap:  09-21-11 neg HPV HR neg MMG:  1/14 negative per patient Colonoscopy: 2009 polyps, scheduled for 10/08/13 BMD:   2013 TDaP:  2008 Labs: Poct urine-ph8 Self breast exam:done occ   reports that she has quit smoking. She does not have any smokeless tobacco history on file. She reports that she drinks alcohol. She reports that she does not use illicit drugs.  Past Medical History  Diagnosis Date  . Hypothyroidism   . Hepatitis C   . Hydatidiform mole   . Osteoporosis   . Pain in joint, ankle and foot   . Otalgia, unspecified   . Abnormal Pap smear of cervix     early 20's  . Lichen sclerosus   . Hydatidiform mole     D&C done (age 20)    Past Surgical History  Procedure Laterality Date  . Eye surgery    . Cystectomy Right 1972    benign tumor removal rt arm  . Tonsillectomy  1958  . Cryotherapy      ? for abnormal pap  . Dilation and curettage of uterus    . Tubal ligation      age 56  . Colonoscopy  1/09    polyp removed    Current Outpatient Prescriptions  Medication Sig Dispense Refill  . Calcium Carbonate-Vit D-Min (CALCIUM 1200 PO) Take 1 tablet by mouth.      . Cholecalciferol (VITAMIN D3) 50000 UNITS CAPS Take 1 capsule by mouth every 30 (thirty) days. Chewable wafer "Replesta"      . magnesium oxide (MAG-OX 400) 400 MG tablet Take 1 tablet (400 mg  total) by mouth daily.      . Probiotic Product (PROBIOTIC PO) Take by mouth as needed.      . thyroid (ARMOUR) 15 MG tablet Take 15 mg by mouth daily.      Marland Kitchen thyroid (ARMOUR) 60 MG tablet Take 60 mg by mouth daily before breakfast.      . zoledronic acid (RECLAST) 5 MG/100ML SOLN injection Inject 5 mg into the vein once.       No current facility-administered medications for this visit.    Family History  Problem Relation Age of Onset  . Ulcerative colitis Mother   . Arthritis Mother   . COPD Mother   . Hyperlipidemia Mother   . Osteoporosis Mother   . Alcohol abuse Father   . Liver disease Father   . Diverticulitis Father   . Heart disease Maternal Grandmother   . Hypertension Maternal Grandmother     ROS:  Pertinent items are noted in HPI.  Otherwise, a comprehensive ROS was negative.  Exam:   BP 102/64  Pulse 68  Resp 16  Ht 5' 2.25" (1.581 m)  Wt 134 lb (60.782 kg)  BMI 24.32 kg/m2  LMP 09/11/2005 Height: 5' 2.25" (158.1 cm)  Ht Readings from Last 3  Encounters:  09/30/13 5' 2.25" (1.581 m)  03/05/13 5\' 2"  (1.575 m)  02/02/12 5' 2.6" (1.59 m)    General appearance: alert, cooperative and appears stated age Head: Normocephalic, without obvious abnormality, atraumatic Neck: no adenopathy, supple, symmetrical, trachea midline and thyroid normal to inspection and palpation, continues to have slightly enlarged lymph node on right submandibular area. Patient had evaluation with [place] and with MD all negative Lungs: clear to auscultation bilaterally Breasts: normal appearance, no masses or tenderness, No nipple retraction or dimpling, No nipple discharge or bleeding, No axillary or supraclavicular adenopathy Heart: regular rate and rhythm Abdomen: soft, non-tender; no masses,  no organomegaly Extremities: extremities normal, atraumatic, no cyanosis or edema Skin: Skin color, texture, turgor normal. No rashes or lesions Lymph nodes: Cervical, supraclavicular, and axillary  nodes normal. No abnormal inguinal nodes palpated Neurologic: Grossly normal   Pelvic: External genitalia:  no lesions              Urethra:  normal appearing urethra with no masses, tenderness or lesions              Bartholin's and Skene's: normal                 Vagina: normal appearing vagina with normal color and discharge, no lesions              Cervix: normal, non tender              Pap taken: no Bimanual Exam:  Uterus:  normal size, contour, position, consistency, mobility, non-tender and mid position              Adnexa: normal adnexa and no mass, fullness, tenderness               Rectovaginal: Confirms               Anus:  normal sphincter tone, no lesions  A:  Well Woman with normal exam  Menopausal no HRT  Osteoporosis with Endocrine management and Reclast/Forteo use  Hypothyroid stable medication with Endocrine management  History of Hep.C, may be candidate for treatment  P:   Reviewed health and wellness pertinent to exam  Aware of need to evaluate if vaginal bleeding  Continue follow up as indicated.  Pap smear as per guidelines   Mammogram yearly, 3D recommended pap smear taken today with reflex  counseled on breast self exam, mammography screening, adequate intake of calcium and vitamin D, diet and exercise, Kegel's exercises  return annually or prn  An After Visit Summary was printed and given to the patient.

## 2013-10-01 ENCOUNTER — Telehealth: Payer: Self-pay | Admitting: Certified Nurse Midwife

## 2013-10-01 LAB — HEPATITIS B SURFACE ANTIBODY,QUALITATIVE: HEP B S AB: NEGATIVE

## 2013-10-01 LAB — HEPATITIS B CORE ANTIBODY, IGM: HEP B C IGM: NONREACTIVE

## 2013-10-01 NOTE — Telephone Encounter (Signed)
Pt has some questions regarding a mammogram and bone density.

## 2013-10-01 NOTE — Telephone Encounter (Signed)
Spoke with pt who usually has her MMG and BMD done at [facility]. They do not offer 3D mammography there. Is it OK for her to just get a regular MMG at [facility]? Pt also has "something of a more personal nature" to discuss with Jackelyn Poling, "nothing urgent," and she would like DL to give her a call when she gets a chance.

## 2013-10-01 NOTE — Progress Notes (Signed)
Reviewed personally.  M. Suzanne Mercia Dowe, MD.  

## 2013-10-02 LAB — IPS PAP TEST WITH REFLEX TO HPV

## 2013-10-02 NOTE — Telephone Encounter (Signed)
Phone call to patient regarding Hep.B immunity level, patient under evaluation for Hep.C. Reviewed Hepatitis B lab results indicate no immune status. Patient to discuss with MD. At Hep.C clinic

## 2013-10-08 LAB — HM COLONOSCOPY: HM COLON: NORMAL

## 2013-10-09 ENCOUNTER — Encounter: Payer: Self-pay | Admitting: *Deleted

## 2013-11-06 ENCOUNTER — Ambulatory Visit: Payer: 59 | Admitting: Family Medicine

## 2013-11-25 ENCOUNTER — Ambulatory Visit: Payer: 59 | Admitting: Family Medicine

## 2014-02-03 ENCOUNTER — Other Ambulatory Visit (HOSPITAL_COMMUNITY): Payer: Self-pay | Admitting: Nurse Practitioner

## 2014-02-03 DIAGNOSIS — C220 Liver cell carcinoma: Secondary | ICD-10-CM

## 2014-03-04 ENCOUNTER — Ambulatory Visit (HOSPITAL_COMMUNITY)
Admission: RE | Admit: 2014-03-04 | Discharge: 2014-03-04 | Disposition: A | Discharge status: Home / Self Care | Payer: 59 | Source: Ambulatory Visit | Attending: Nurse Practitioner | Admitting: Nurse Practitioner

## 2014-03-04 DIAGNOSIS — C220 Liver cell carcinoma: Secondary | ICD-10-CM

## 2014-03-04 DIAGNOSIS — B1920 Unspecified viral hepatitis C without hepatic coma: Secondary | ICD-10-CM | POA: Insufficient documentation

## 2014-03-04 DIAGNOSIS — K824 Cholesterolosis of gallbladder: Secondary | ICD-10-CM | POA: Insufficient documentation

## 2014-05-19 ENCOUNTER — Telehealth: Payer: Self-pay | Admitting: Certified Nurse Midwife

## 2014-05-19 DIAGNOSIS — M810 Age-related osteoporosis without current pathological fracture: Secondary | ICD-10-CM

## 2014-05-19 NOTE — Telephone Encounter (Signed)
Patient is currently taking Vitamin D3 50,000 1 every 30 days. Last Vitamin D level was taken on 1/16 and was 70. Okay to place order for Vitamin D level at this time?

## 2014-05-19 NOTE — Telephone Encounter (Signed)
yes

## 2014-05-19 NOTE — Telephone Encounter (Signed)
Pt is asking for an order vit D 3 level to be sent to [facility] lab. Pt said she has to get some other labs done for another office there. Wants to just have it done at the same time

## 2014-05-20 NOTE — Telephone Encounter (Signed)
Left detailed message at number provided [phone]. Okay per ROI. Advised order has been placed for Vitamin D level to be drawn at another location.   Routing to provider for final review. Patient agreeable to disposition. Will close encounter

## 2014-05-23 LAB — VITAMIN D 25 HYDROXY (VIT D DEFICIENCY, FRACTURES): Vit D, 25-Hydroxy: 63 ng/mL (ref 30–89)

## 2014-07-13 ENCOUNTER — Encounter: Payer: Self-pay | Admitting: Certified Nurse Midwife

## 2014-07-14 ENCOUNTER — Ambulatory Visit: Payer: 59 | Attending: Physical Therapy | Admitting: Physical Therapy

## 2014-07-14 DIAGNOSIS — M810 Age-related osteoporosis without current pathological fracture: Secondary | ICD-10-CM | POA: Diagnosis not present

## 2014-07-14 DIAGNOSIS — B1920 Unspecified viral hepatitis C without hepatic coma: Secondary | ICD-10-CM | POA: Insufficient documentation

## 2014-07-14 DIAGNOSIS — M545 Low back pain: Secondary | ICD-10-CM | POA: Diagnosis not present

## 2014-07-14 DIAGNOSIS — M546 Pain in thoracic spine: Secondary | ICD-10-CM | POA: Insufficient documentation

## 2014-07-14 DIAGNOSIS — Z5189 Encounter for other specified aftercare: Secondary | ICD-10-CM | POA: Insufficient documentation

## 2014-07-22 ENCOUNTER — Ambulatory Visit: Payer: 59 | Admitting: Physical Therapy

## 2014-07-22 DIAGNOSIS — Z5189 Encounter for other specified aftercare: Secondary | ICD-10-CM | POA: Diagnosis not present

## 2014-10-01 ENCOUNTER — Ambulatory Visit: Payer: 59 | Admitting: Certified Nurse Midwife

## 2014-11-10 ENCOUNTER — Other Ambulatory Visit (HOSPITAL_COMMUNITY)
Admission: AD | Admit: 2014-11-10 | Discharge: 2014-11-10 | Disposition: A | Discharge status: Home / Self Care | Payer: 59 | Source: Ambulatory Visit | Attending: Nurse Practitioner | Admitting: Nurse Practitioner

## 2014-11-10 DIAGNOSIS — B182 Chronic viral hepatitis C: Secondary | ICD-10-CM | POA: Diagnosis not present

## 2014-11-10 LAB — COMPREHENSIVE METABOLIC PANEL
ALT: 20 U/L (ref 0–35)
ANION GAP: 5 (ref 5–15)
AST: 23 U/L (ref 0–37)
Albumin: 4.2 g/dL (ref 3.5–5.2)
Alkaline Phosphatase: 69 U/L (ref 39–117)
BUN: 18 mg/dL (ref 6–23)
CHLORIDE: 105 mmol/L (ref 96–112)
CO2: 30 mmol/L (ref 19–32)
CREATININE: 0.74 mg/dL (ref 0.50–1.10)
Calcium: 9.1 mg/dL (ref 8.4–10.5)
GFR calc non Af Amer: >90 mL/min (ref >90)
Glucose, Bld: 84 mg/dL (ref 70–99)
Potassium: 3.9 mmol/L (ref 3.5–5.1)
Sodium: 140 mmol/L (ref 135–145)
TOTAL PROTEIN: 7.4 g/dL (ref 6.0–8.3)
Total Bilirubin: 0.9 mg/dL (ref 0.3–1.2)

## 2014-11-10 LAB — CBC
HCT: 39.3 % (ref 36.0–46.0)
Hemoglobin: 13.7 g/dL (ref 12.0–15.0)
MCH: 31.3 pg (ref 26.0–34.0)
MCHC: 34.9 g/dL (ref 30.0–36.0)
MCV: 89.7 fL (ref 78.0–100.0)
PLATELETS: 250 10*3/uL (ref 150–400)
RBC: 4.38 MIL/uL (ref 3.87–5.11)
RDW: 13 % (ref 11.5–15.5)
WBC: 6.9 10*3/uL (ref 4.0–10.5)

## 2014-11-10 LAB — PROTIME-INR
INR: 0.96 (ref 0.00–1.49)
Prothrombin Time: 12.8 seconds (ref 11.6–15.2)

## 2014-11-11 ENCOUNTER — Ambulatory Visit (INDEPENDENT_AMBULATORY_CARE_PROVIDER_SITE_OTHER): Payer: 59 | Admitting: Certified Nurse Midwife

## 2014-11-11 ENCOUNTER — Encounter: Payer: Self-pay | Admitting: Certified Nurse Midwife

## 2014-11-11 VITALS — BP 102/62 | HR 60 | Resp 16 | Ht 62.5 in | Wt 130.0 lb

## 2014-11-11 DIAGNOSIS — Z01419 Encounter for gynecological examination (general) (routine) without abnormal findings: Secondary | ICD-10-CM | POA: Diagnosis not present

## 2014-11-11 DIAGNOSIS — Z0000 Encounter for general adult medical examination without abnormal findings: Secondary | ICD-10-CM | POA: Diagnosis not present

## 2014-11-11 LAB — POCT URINALYSIS DIPSTICK
Bilirubin, UA: NEGATIVE
Blood, UA: NEGATIVE
Glucose, UA: NEGATIVE
Ketones, UA: NEGATIVE
Leukocytes, UA: NEGATIVE
Nitrite, UA: NEGATIVE
PROTEIN UA: NEGATIVE
UROBILINOGEN UA: NEGATIVE
pH, UA: 6

## 2014-11-11 NOTE — Patient Instructions (Signed)

## 2014-11-11 NOTE — Progress Notes (Signed)
59 y.o. [id number] Married  Caucasian Fe here for annual exam. Menopausal no HRT. Denies vaginal dryness/or vaginal bleeding. Treated  Hepatitis C this past year with cocktail medication and now is not showing active virus now. Seeing Endocrine for Hypothyroid management, stable. Sees PCP for aex,labs and Osteoporosis management. Has decreased work hours to 3 days per week now, feeling less fatigue now. No other health issues today.    Patient's last menstrual period was 09/11/2005.          Sexually active: Yes.    The current method of family planning is post menopausal status.    Exercising: No.  The patient does not participate in regular exercise at present. Smoker:  no  Health Maintenance: Pap:  09/30/13 Neg Hx of abnormal pap - early 20's. MMG:  10/2013 BIRADS1:neg due will schedule Self Breast Check: yes, every couple of months Colonoscopy:  09/2013 Repeat 5 years  BMD:  10/16/13 Improved TDaP:  2008 Labs: 11/10/14   UA: Negative   reports that she has quit smoking. She has never used smokeless tobacco. She reports that she drinks about 0.6 - 1.2 oz of alcohol per week. She reports that she does not use illicit drugs.  Past Medical History  Diagnosis Date  . Hypothyroidism   . Hepatitis C   . Hydatidiform mole   . Osteoporosis   . Pain in joint, ankle and foot   . Otalgia, unspecified   . Abnormal Pap smear of cervix     early 20's  . Lichen sclerosus   . Hydatidiform mole     D&C done (age 2)    Past Surgical History  Procedure Laterality Date  . Eye surgery    . Cystectomy Right 1972    benign tumor removal rt arm  . Tonsillectomy  1958  . Cryotherapy      ? for abnormal pap  . Dilation and curettage of uterus    . Tubal ligation      age 89  . Colonoscopy  1/09    polyp removed    Current Outpatient Prescriptions  Medication Sig Dispense Refill  . alendronate (FOSAMAX) 70 MG tablet Take 70 mg by mouth once a week.     . Calcium Carbonate-Vit D-Min (CALCIUM 1200  PO) Take 1 tablet by mouth.    . magnesium oxide (MAG-OX 400) 400 MG tablet Take 1 tablet (400 mg total) by mouth daily.    . Probiotic Product (PROBIOTIC PO) Take by mouth as needed.    . thyroid (ARMOUR) 15 MG tablet Take 15 mg by mouth daily.    Marland Kitchen thyroid (ARMOUR) 60 MG tablet Take 60 mg by mouth daily before breakfast.     No current facility-administered medications for this visit.    Family History  Problem Relation Age of Onset  . Ulcerative colitis Mother   . Arthritis Mother   . COPD Mother   . Hyperlipidemia Mother   . Osteoporosis Mother   . Alcohol abuse Father   . Liver disease Father   . Diverticulitis Father   . Heart disease Maternal Grandmother   . Hypertension Maternal Grandmother     ROS:  Pertinent items are noted in HPI.  Otherwise, a comprehensive ROS was negative.  Exam:   BP 102/62 mmHg  Pulse 60  Resp 16  Ht 5' 2.5" (1.588 m)  Wt 130 lb (58.968 kg)  BMI 23.38 kg/m2  LMP 09/11/2005 Height: 5' 2.5" (158.8 cm) Ht Readings from Last 3 Encounters:  11/11/14 5' 2.5" (1.588 m)  09/30/13 5' 2.25" (1.581 m)  03/05/13 5\' 2"  (1.575 m)    General appearance: alert, cooperative and appears stated age Head: Normocephalic, without obvious abnormality, atraumatic Neck: no adenopathy, supple, symmetrical, trachea midline and thyroid normal to inspection and palpation Lungs: clear to auscultation bilaterally Breasts: normal appearance, no masses or tenderness, No nipple retraction or dimpling, No nipple discharge or bleeding, No axillary or supraclavicular adenopathy Heart: regular rate and rhythm Abdomen: soft, non-tender; no masses,  no organomegaly Extremities: extremities normal, atraumatic, no cyanosis or edema Skin: Skin color, texture, turgor normal. No rashes or lesions Lymph nodes: Cervical, supraclavicular, and axillary nodes normal. No abnormal inguinal nodes palpated Neurologic: Grossly normal   Pelvic: External genitalia:  no lesions, normal  female, no LS  noted              Urethra:  normal appearing urethra with no masses, tenderness or lesions              Bartholin's and Skene's: normal                 Vagina: atrophic appearing vagina with normal color and discharge, no lesions              Cervix: non tender, no lesions, normal appearance              Pap taken: Yes.   Bimanual Exam:  Uterus:  normal size, contour, position, consistency, mobility, non-tender and anteverted              Adnexa: normal adnexa and no mass, fullness, tenderness               Rectovaginal: Confirms               Anus:  normal sphincter tone, no lesions   A:  Well Woman with normal exam  Menopausal no HRT  Hepatitis C treated with no detection of virus now  Hypothyroid on stable medication with Dr Chalmers Cater  Osteoporosis with Fosamax use with PCP management  Vaginal dryness improved with coconut oil use  P:   Reviewed health and wellness pertinent to exam  Aware of need to evaluate if vaginal bleeding  Continue follow up with MD as indicated  Pap smear not taken today   counseled on breast self exam, mammography screening, adequate intake of calcium and vitamin D, diet and exercise  return annually or prn  An After Visit Summary was printed and given to the patient.

## 2014-11-12 LAB — HCV RNA QUANT RFLX ULTRA OR GENOTYP: HCV Quantitative: NOT DETECTED IU/mL — ABNORMAL LOW (ref <15)

## 2014-11-17 LAB — MISCELLANEOUS TEST

## 2014-11-17 NOTE — Progress Notes (Signed)
Reviewed personally.  M. Suzanne Kariann Wecker, MD.  

## 2014-12-23 ENCOUNTER — Telehealth: Payer: Self-pay | Admitting: Certified Nurse Midwife

## 2014-12-23 NOTE — Telephone Encounter (Signed)
LMTCB about canceled appt with DL

## 2015-02-03 ENCOUNTER — Telehealth: Payer: Self-pay | Admitting: Certified Nurse Midwife

## 2015-02-03 NOTE — Telephone Encounter (Signed)
Patient has a spot on her right breast she would like check. Chart to triage.

## 2015-02-03 NOTE — Telephone Encounter (Signed)
Left message to call Kaitlyn at [phone]. 

## 2015-02-03 NOTE — Telephone Encounter (Signed)
Spoke with patient. Patient states that she has a "lump" on her right breast that she would like to get checked. First noticed in March. Was seen for aex on 11/11/2014 with Regina Eck CNM and had breast check. States she had a mammogram recently that was normal. Last mammogram we have on file is from 10/16/2013. Patient denies any pain, redness, or swelling to the area. Patient lives out of town and would like to schedule for June 2nd. Appointment scheduled for 02/11/2015 at 2:30pm with Regina Eck CNM. Patient is agreeable to date and time.  Routing to provider for final review. Patient agreeable to disposition. Will close encounter.

## 2015-02-11 ENCOUNTER — Ambulatory Visit (INDEPENDENT_AMBULATORY_CARE_PROVIDER_SITE_OTHER): Payer: 59 | Admitting: Certified Nurse Midwife

## 2015-02-11 VITALS — BP 98/72 | HR 74 | Ht 62.5 in | Wt 131.6 lb

## 2015-02-11 DIAGNOSIS — N63 Unspecified lump in breast: Secondary | ICD-10-CM | POA: Diagnosis not present

## 2015-02-11 DIAGNOSIS — N6310 Unspecified lump in the right breast, unspecified quadrant: Secondary | ICD-10-CM

## 2015-02-11 NOTE — Progress Notes (Signed)
Scheduled patient for right breast diagnostic and ultrasound at [facility] for 6/3 at 8:30am.

## 2015-02-11 NOTE — Progress Notes (Signed)
   Subjective:   59 y.o. MarriedCaucasian female presents for evaluation of right breast mass. Onset of the symptoms was year. Patient sought evaluation because of breast lump.  Contributing factors include none.. Denies nipple discharge, skin change or tenderness.. Patient denies hiistory of trauma, bites, or injuries. Last mammogram was 1 month and was negative.  Previous evaluation none at present.   Review of Systems Pertinent items are noted in HPI.   Objective:   General appearance: alert, cooperative, appears stated age and fatigued Breasts: normal appearance, no masses or tenderness, No nipple retraction or dimpling, No nipple discharge or bleeding, positive findings: right breast at 2 o'clock small pea size mass mobile, slightly firm, non tender at 3 fb out from aerola  Assessment:   ASSESSMENT:Patient is diagnosed with right breast mass   Plan:   PLAN: Discussed findings with patient and need for further evaluation. Patient agreeable. Patient will be scheduled for 3D diagnostic mammogram and [place] prior to leaving office. Questions addressed.  RV prn

## 2015-02-12 ENCOUNTER — Encounter: Payer: Self-pay | Admitting: Certified Nurse Midwife

## 2015-02-12 NOTE — Progress Notes (Signed)
Reviewed personally.  M. Suzanne Kaydynce Pat, MD.  

## 2015-02-16 ENCOUNTER — Telehealth: Payer: Self-pay

## 2015-02-16 NOTE — Telephone Encounter (Signed)
Lmtcb//kn 

## 2015-03-04 NOTE — Telephone Encounter (Signed)
Lmtcb//kn 

## 2015-04-01 NOTE — Telephone Encounter (Signed)
lmtcb to schedule recheck appointment.//kn

## 2015-04-06 ENCOUNTER — Telehealth: Payer: Self-pay | Admitting: Certified Nurse Midwife

## 2015-04-06 NOTE — Telephone Encounter (Signed)
Patient received a message to call and schedule a recheck. Patient in not sure why she needs to return. Please call to clarify. Last seen 02/11/15.

## 2015-04-06 NOTE — Telephone Encounter (Signed)
Yes, I gave her results to DL to advise since I had not heard from her. I think she wanted it to be 2-3 months, I can call her once I get the note from DL.//kn

## 2015-04-06 NOTE — Telephone Encounter (Signed)
Robley Fries, CMA at 04/01/2015 2:18 PM     Status: Signed       Expand All Collapse All   lmtcb to schedule recheck appointment.//kn            Robley Fries, CMA at 03/04/2015 2:48 PM     Status: Signed       Expand All Collapse All   Lmtcb//kn            Robley Fries, CMA at 02/16/2015 4:31 PM     Status: Signed       Expand All Collapse All   Lmtcb//kn

## 2015-04-06 NOTE — Telephone Encounter (Signed)
Routing to [employer], CMA for review. Is this for a breast recheck appointment?

## 2015-04-08 NOTE — Telephone Encounter (Signed)
Spoke with patient, states that she has been having right sided pressure and would like to be seen for this. Aware will also recheck breast at this time. Appointment scheduled for 04/13/15./kn

## 2015-04-13 ENCOUNTER — Ambulatory Visit (INDEPENDENT_AMBULATORY_CARE_PROVIDER_SITE_OTHER): Payer: 59 | Admitting: Certified Nurse Midwife

## 2015-04-13 VITALS — BP 120/84 | Temp 97.8°F | Ht 62.5 in | Wt 132.0 lb

## 2015-04-13 DIAGNOSIS — Z1239 Encounter for other screening for malignant neoplasm of breast: Secondary | ICD-10-CM

## 2015-04-13 DIAGNOSIS — R102 Pelvic and perineal pain: Secondary | ICD-10-CM

## 2015-04-13 DIAGNOSIS — N9489 Other specified conditions associated with female genital organs and menstrual cycle: Secondary | ICD-10-CM | POA: Diagnosis not present

## 2015-04-13 LAB — POCT URINALYSIS DIPSTICK
LEUKOCYTES UA: NEGATIVE
Urobilinogen, UA: NEGATIVE
pH, UA: 5

## 2015-04-13 NOTE — Progress Notes (Addendum)
59 y.o. Married  female  [id number] here for complaint of low right pelvic pain, which radiates upper area. Liver studies normal, gallbladder polyps noted a year ago. Patient has noticed this has occurred more frequently with constipation. Does feels less pressure with bowel movement. Empties bowel usually every 2-3 days. Patient denies urinary frequency or urgency, blood in stool( only with straining), or tarry stools. Pain started 2 - 4 weeks ago with more bloating and discomfort. ago.  Pain is primarily located RLQ and is described as intermittent.  Pain is aggravated by constipation  Denies any nausea or vomiting. Patient is on Fosamax for osteoporosis and feels this may been causing some of the constipation. Menopausal no vaginal bleeding but some vaginal dryness uses coconut oil. Patient also here for follow up breast exam after diagnostic mammogram for firmness noted in right breast. Diagnostic mammogram and [place] negative. Patient has not noted any other changes.  No other concerns today.  ROS: pertinent to history above  Exam:   BP 120/84 mmHg  Temp(Src) 97.8 F (36.6 C) (Oral)  Ht 5' 2.5" (1.588 m)  Wt 132 lb (59.875 kg)  BMI 23.74 kg/m2  LMP 09/11/2005   General appearance: alert, cooperative, appears stated age and no distress  Skin warm and dry Breast: no masses, no skin change, no nipple discharge or tenderness noted Axillary lymph nodes negative for enlargement or tenderness Abdomen:  soft with slight pressure and tenderness noted in RLQ. BS normal. Deep palpation does not cause pain. Slight firness noted in RMQ area, no rebound. No rebound or tenderness on left. Lymph:  Inguinal lymph nodes non tender not enlarged bilateral   Pelvic: External genitalia:  no lesions and atrophic appearance              Urethra: normal appearing urethra with no masses, tenderness or lesions              Bartholins and Skenes: Bartholin's, Urethra, Skene's normal                 Vagina: normal appearing  vagina with normal color and discharge, no lesions, atrophic, scant moisture, non tender              Cervix: normal appearance and non tender,               Pap taken: No. Bimanual Exam:  Uterus:  uterus is normal size, shape, consistency and nontender, mid position                               Adnexa:    normal adnexa in size, nontender and no masses,but firmness noted on right                               Rectovaginal: confirmed                               Anus:  defer exam   A: RLQ pain  R/O mass ? GI Normal breast exam,no masses palpated History of constipation and Gallbladder polyps and Hep.C treated History of Hydatiform mole     P: Discussed finding of slight firmness in area of concern and recommend PUS for evaluation. Patient agreeable. Patient will be scheduled prior to leaving with instructions. Discussed if benign finding would then proceed with GI evaluation.  Warning signs of pelvic pain and abdominal pain given.  Labs:  CBC with diff  Discussed normal breast findings and need for 3 D mammogram for routine screening. Continue regular SBE and advise if change.  RV prn  An After Visit Summary was printed and given to the patient.

## 2015-04-13 NOTE — Progress Notes (Signed)
Pelvic ultrasound scheduled for 04-15-15 at 2:30 pm here in office. Patient agreeable to date and time.

## 2015-04-14 ENCOUNTER — Encounter: Payer: Self-pay | Admitting: Certified Nurse Midwife

## 2015-04-14 ENCOUNTER — Telehealth: Payer: Self-pay | Admitting: Obstetrics & Gynecology

## 2015-04-14 LAB — CBC WITH DIFFERENTIAL/PLATELET
BASOS ABS: 0.1 10*3/uL (ref 0.0–0.1)
Basophils Relative: 1 % (ref 0–1)
EOS ABS: 0.3 10*3/uL (ref 0.0–0.7)
Eosinophils Relative: 4 % (ref 0–5)
HCT: 41.5 % (ref 36.0–46.0)
Hemoglobin: 13.8 g/dL (ref 12.0–15.0)
Lymphocytes Relative: 37 % (ref 12–46)
Lymphs Abs: 3.1 10*3/uL (ref 0.7–4.0)
MCH: 29.6 pg (ref 26.0–34.0)
MCHC: 33.3 g/dL (ref 30.0–36.0)
MCV: 89.1 fL (ref 78.0–100.0)
MPV: 9.8 fL (ref 8.6–12.4)
Monocytes Absolute: 0.6 10*3/uL (ref 0.1–1.0)
Monocytes Relative: 7 % (ref 3–12)
Neutro Abs: 4.2 10*3/uL (ref 1.7–7.7)
Neutrophils Relative %: 51 % (ref 43–77)
PLATELETS: 270 10*3/uL (ref 150–400)
RBC: 4.66 MIL/uL (ref 3.87–5.11)
RDW: 13.5 % (ref 11.5–15.5)
WBC: 8.3 10*3/uL (ref 4.0–10.5)

## 2015-04-14 NOTE — Telephone Encounter (Signed)
Called patient to review benefits for procedure. Left voicemail to call back and review. °

## 2015-04-14 NOTE — Telephone Encounter (Signed)
Spoke with patient and reviewed benefits. Patient understood and agreeable. Verified appointment time. Ok to close

## 2015-04-14 NOTE — Progress Notes (Signed)
Reviewed personally.  M. Suzanne Jas Betten, MD.  

## 2015-04-15 ENCOUNTER — Ambulatory Visit (INDEPENDENT_AMBULATORY_CARE_PROVIDER_SITE_OTHER): Payer: 59

## 2015-04-15 ENCOUNTER — Encounter: Payer: Self-pay | Admitting: Obstetrics & Gynecology

## 2015-04-15 ENCOUNTER — Ambulatory Visit (INDEPENDENT_AMBULATORY_CARE_PROVIDER_SITE_OTHER): Payer: 59 | Admitting: Obstetrics & Gynecology

## 2015-04-15 VITALS — BP 120/72 | HR 64 | Resp 16 | Wt 132.0 lb

## 2015-04-15 DIAGNOSIS — R1031 Right lower quadrant pain: Secondary | ICD-10-CM | POA: Diagnosis not present

## 2015-04-15 DIAGNOSIS — N9489 Other specified conditions associated with female genital organs and menstrual cycle: Secondary | ICD-10-CM

## 2015-04-15 DIAGNOSIS — R102 Pelvic and perineal pain: Secondary | ICD-10-CM

## 2015-04-15 NOTE — Progress Notes (Signed)
59 y.o.Marriedfemale here for a pelvic ultrasound due to mid right abdominal pain, possible constipation.  Pt feels like this all started after beginning Fosamax in January.  Has been on supplemental calcium for several years and has not had problems with this.  Saw French Ana, CNM, 04/13/15 with firmness noted on physical exam (possible stool).  Pt also has hx of gall bladder polyps and repeat ultrasound for this is due.  She is planning follow up with Dr. Collene Mares for this.  Patient's last menstrual period was 09/11/2005.  Sexually active:  yes  Contraception: PMP status  FINDINGS: UTERUS: 5.9 x 4.4. X 2.9cm EMS:  1.18mm ADNEXA:   Left ovary not seen despite looking both with transvaginal and transabdominal ultrasound   Right ovary 0.9 x 0.9 x 0.6cm CUL DE SAC: no free fluid  Findings reviewed with pt.  She is going to proceed with stopping Fosamax to see if this helps and will follow up with Dr. Collene Mares, GI.  Declines needing help with referral/appt.  All questions answered.   Assessment:  RLQ pain Constipation Gallbladder polyps  Plan: with normal ultrasound, pt will stop Fosamax and proceed with follow up with Dr. Collene Mares (for this issue and for h/o gallbladder polyps and need for follow up RUQ ultrasound).  ~15 minutes spent with patient >50% of time was in face to face discussion of above.

## 2015-04-28 ENCOUNTER — Other Ambulatory Visit: Payer: Self-pay | Admitting: Gastroenterology

## 2015-04-28 DIAGNOSIS — K824 Cholesterolosis of gallbladder: Secondary | ICD-10-CM

## 2015-05-05 ENCOUNTER — Ambulatory Visit (HOSPITAL_COMMUNITY): Payer: 59

## 2015-05-07 ENCOUNTER — Ambulatory Visit (HOSPITAL_COMMUNITY)
Admission: RE | Admit: 2015-05-07 | Discharge: 2015-05-07 | Disposition: A | Discharge status: Home / Self Care | Payer: 59 | Source: Ambulatory Visit | Attending: Gastroenterology | Admitting: Gastroenterology

## 2015-05-07 DIAGNOSIS — B1920 Unspecified viral hepatitis C without hepatic coma: Secondary | ICD-10-CM | POA: Insufficient documentation

## 2015-05-07 DIAGNOSIS — K824 Cholesterolosis of gallbladder: Secondary | ICD-10-CM | POA: Insufficient documentation

## 2015-05-20 NOTE — Telephone Encounter (Signed)
Brianna Greene, CMA did we ever hear from [name] CNM regarding breast recheck recommendations?

## 2015-05-24 NOTE — Telephone Encounter (Signed)
DL saw her on 04/13/15 and was going to do it then.//kn

## 2015-05-24 NOTE — Telephone Encounter (Signed)
Thank you. I will close the encounter.  Cc: [name] CNM  Routing to provider for final review. Patient agreeable to disposition. Will close encounter.

## 2015-09-21 MED FILL — ARMOUR THYROID 15 MG TABLET: 15 | 30 days supply | Qty: 30 | Fill #3

## 2015-09-21 MED FILL — ALENDRONATE NA 70 MG TAB: 70 | 28 days supply | Qty: 4 | Fill #2

## 2015-09-21 MED FILL — ARMOUR THYROID 60 MG TABLET: 60 | 30 days supply | Qty: 30 | Fill #0

## 2015-10-18 MED FILL — ARMOUR THYROID 60 MG TABLET: 60 | 30 days supply | Qty: 30 | Fill #1

## 2015-10-18 MED FILL — ALENDRONATE NA 70 MG TAB: 70 | 28 days supply | Qty: 4 | Fill #3

## 2015-10-18 MED FILL — ARMOUR THYROID 15 MG TABLET: 15 | 30 days supply | Qty: 30 | Fill #4

## 2015-11-12 DIAGNOSIS — Z6823 Body mass index (BMI) 23.0-23.9, adult: Secondary | ICD-10-CM | POA: Diagnosis not present

## 2015-11-12 DIAGNOSIS — R42 Dizziness and giddiness: Secondary | ICD-10-CM | POA: Diagnosis not present

## 2015-11-15 ENCOUNTER — Ambulatory Visit: Payer: 59 | Admitting: Certified Nurse Midwife

## 2015-11-17 ENCOUNTER — Ambulatory Visit: Payer: 59 | Admitting: Certified Nurse Midwife

## 2015-11-17 MED FILL — ALENDRONATE NA 70 MG TAB: 70 | 28 days supply | Qty: 4 | Fill #4

## 2015-11-17 MED FILL — ARMOUR THYROID 60 MG TABLET: 60 | 30 days supply | Qty: 30 | Fill #2

## 2015-11-17 MED FILL — ARMOUR THYROID 15 MG TABLET: 15 | 30 days supply | Qty: 30 | Fill #5

## 2015-12-10 ENCOUNTER — Ambulatory Visit (INDEPENDENT_AMBULATORY_CARE_PROVIDER_SITE_OTHER): Payer: 59 | Admitting: Certified Nurse Midwife

## 2015-12-10 ENCOUNTER — Encounter: Payer: Self-pay | Admitting: Certified Nurse Midwife

## 2015-12-10 VITALS — BP 104/62 | HR 56 | Ht 62.5 in | Wt 133.0 lb

## 2015-12-10 DIAGNOSIS — Z124 Encounter for screening for malignant neoplasm of cervix: Secondary | ICD-10-CM | POA: Diagnosis not present

## 2015-12-10 DIAGNOSIS — Z01419 Encounter for gynecological examination (general) (routine) without abnormal findings: Secondary | ICD-10-CM

## 2015-12-10 DIAGNOSIS — Z1151 Encounter for screening for human papillomavirus (HPV): Secondary | ICD-10-CM | POA: Diagnosis not present

## 2015-12-10 NOTE — Progress Notes (Signed)
Patient ID: Brianna Greene, female   DOB: 1956/08/14, 60 y.o.   MRN: [id number]  60 y.o. [id number] Married  Caucasian Fe here for annual exam. Menopausal no HRT. Denies vaginal bleeding or vaginal dryness. Sees Endocrine for Hypothyroid management, stable medication. Sees Dr. Stephan Minister with [facility]  yearly and labs. Still on medications for osteoporosis with Endocrine management, all stable. Has noted occasional palpitations and had EKG with PCP all normal.  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 Plans no more BMD due to treatment and would not do any more. No other health issues today.  Patient's last menstrual period was 09/11/2005 (approximate).          Sexually active: Yes.    The current method of family planning is post menopausal status.    Exercising: No.  The patient does not participate in regular exercise at present. Smoker:  no  Health Maintenance: Pap: 09/30/13, Negative Hx of abnormal pap - early 20's. MMG:01/12/15, 3D, Density Category B; Right Diagnostic MMG and ultrasound 02/12/15, Bi-Rads 1:  Negative, repeat screening in one year Self Breast Check: yes Colonoscopy: 09/2013 Repeat 5 years  BMD: 10/16/13 Improved TDaP: 2008 Shingles: never, does not want Pneumonia: Not indicated due to age Hep C and HIV: Hep C 05/07/15 treated, HIV screen in past Labs: PCP 08/2015   reports that she has quit smoking. She has never used smokeless tobacco. She reports that she drinks about 0.6 - 1.2 oz of alcohol per week. She reports that she does not use illicit drugs.  Past Medical History  Diagnosis Date  . Hypothyroidism   . Hepatitis C   . Hydatidiform mole   . Osteoporosis   . Pain in joint, ankle and foot   . Otalgia, unspecified   . Abnormal Pap smear of cervix     early 20's  . Lichen sclerosus   . Hydatidiform mole     D&C done (age 59)    Past Surgical History  Procedure Laterality Date  . Eye surgery    . Cystectomy Right 1972    benign tumor  removal rt arm  . Tonsillectomy  1958  . Cryotherapy      ? for abnormal pap  . Dilation and curettage of uterus    . Tubal ligation      age 42  . Colonoscopy  1/09    polyp removed    Current Outpatient Prescriptions  Medication Sig Dispense Refill  . alendronate (FOSAMAX) 70 MG tablet Take 70 mg by mouth once a week.     . Calcium 250 MG CAPS Take 3 tablets by mouth daily.    . Cholecalciferol (VITAMIN D) 2000 units CAPS Take 1 capsule by mouth daily.    . Magnesium 500 MG CAPS Take 1 capsule by mouth daily.    . Probiotic Product (PROBIOTIC PO) Take by mouth as needed.    . thyroid (ARMOUR) 15 MG tablet Take 15 mg by mouth daily.    Marland Kitchen thyroid (ARMOUR) 60 MG tablet Take 60 mg by mouth daily before breakfast.     No current facility-administered medications for this visit.    Family History  Problem Relation Age of Onset  . Ulcerative colitis Mother   . Arthritis Mother   . COPD Mother   . Hyperlipidemia Mother   . Osteoporosis Mother   . Alcohol abuse Father   . Liver disease Father   . Diverticulitis Father   . Heart disease Maternal Grandmother   .  Hypertension Maternal Grandmother     ROS:  Pertinent items are noted in HPI.  Otherwise, a comprehensive ROS was negative.  Exam:   BP 104/62 mmHg  Pulse 56  Ht 5' 2.5" (1.588 m)  Wt 133 lb (60.328 kg)  BMI 23.92 kg/m2  LMP 09/11/2005 (Approximate) Height: 5' 2.5" (158.8 cm) Ht Readings from Last 3 Encounters:  12/10/15 5' 2.5" (1.588 m)  04/13/15 5' 2.5" (1.588 m)  02/11/15 5' 2.5" (1.588 m)    General appearance: alert, cooperative and appears stated age Head: Normocephalic, without obvious abnormality, atraumatic Neck: no adenopathy, supple, symmetrical, trachea midline and thyroid mild enlargement no nodules noted Lungs: clear to auscultation bilaterally Breasts: normal appearance, no masses or tenderness, No nipple retraction or dimpling, No nipple discharge or bleeding, No axillary or supraclavicular  adenopathy Heart: regular rate and rhythm Abdomen: soft, non-tender; no masses,  no organomegaly Extremities: extremities normal, atraumatic, no cyanosis or edema Skin: Skin color, texture, turgor normal. No rashes or lesions Lymph nodes: Cervical, supraclavicular, and axillary nodes normal. No abnormal inguinal nodes palpated Neurologic: Grossly normal   Pelvic: External genitalia:  no lesions              Urethra:  normal appearing urethra with no masses, tenderness or lesions              Bartholin's and Skene's: normal                 Vagina: normal appearing vagina with normal color and discharge, no lesions              Cervix: normal,non tender,no lesions              Pap taken: Yes.   Bimanual Exam:  Uterus:  normal size, contour, position, consistency, mobility, non-tender              Adnexa: normal adnexa and no mass, fullness, tenderness               Rectovaginal: Confirms               Anus:  normal sphincter tone, no lesions  Chaperone present: no patient request  A:  Well Woman with normal exam   Menopausal no HRT  Hypothyroid/Osteoporosis with Endocrine management stable  Hep.C chronic with treatment, now resolved  P:   Reviewed health and wellness pertinent to exam  Aware to advise if vaginal bleeding  Continue with MD as indicated  Pap smear as above with HPVHR   counseled on breast self exam, mammography screening, adequate intake of calcium and vitamin D, diet and exercise  return annually or prn  An After Visit Summary was printed and given to the patient.

## 2015-12-10 NOTE — Progress Notes (Signed)
Encounter reviewed Billey Wojciak, MD   

## 2015-12-10 NOTE — Patient Instructions (Signed)

## 2015-12-14 LAB — IPS PAP TEST WITH HPV

## 2015-12-15 MED FILL — ARMOUR THYROID 60 MG TABLET: 60 | 30 days supply | Qty: 30 | Fill #3

## 2015-12-15 MED FILL — ALENDRONATE NA 70 MG TAB: 70 | 28 days supply | Qty: 4 | Fill #5

## 2015-12-15 MED FILL — ARMOUR THYROID 15 MG TABLET: 15 | 30 days supply | Qty: 30 | Fill #0

## 2015-12-29 DIAGNOSIS — H109 Unspecified conjunctivitis: Secondary | ICD-10-CM | POA: Diagnosis not present

## 2015-12-29 DIAGNOSIS — Z6824 Body mass index (BMI) 24.0-24.9, adult: Secondary | ICD-10-CM | POA: Diagnosis not present

## 2015-12-29 MED FILL — TOBRAMYCIN-DEXAMETH OPTH SU: 0.3-0.1 | 30 days supply | Qty: 10 | Fill #0

## 2016-01-04 DIAGNOSIS — E039 Hypothyroidism, unspecified: Secondary | ICD-10-CM | POA: Diagnosis not present

## 2016-01-07 DIAGNOSIS — H1012 Acute atopic conjunctivitis, left eye: Secondary | ICD-10-CM | POA: Diagnosis not present

## 2016-01-07 DIAGNOSIS — H01025 Squamous blepharitis left lower eyelid: Secondary | ICD-10-CM | POA: Diagnosis not present

## 2016-01-07 DIAGNOSIS — H01024 Squamous blepharitis left upper eyelid: Secondary | ICD-10-CM | POA: Diagnosis not present

## 2016-01-12 DIAGNOSIS — E039 Hypothyroidism, unspecified: Secondary | ICD-10-CM | POA: Diagnosis not present

## 2016-01-13 MED FILL — ALENDRONATE NA 70 MG TAB: 70 | 28 days supply | Qty: 4 | Fill #6

## 2016-01-13 MED FILL — ARMOUR THYROID 60 MG TABLET: 60 | 30 days supply | Qty: 30 | Fill #4

## 2016-01-13 MED FILL — ARMOUR THYROID 15 MG TABLET: 15 | 30 days supply | Qty: 30 | Fill #1

## 2016-02-08 MED FILL — ALENDRONATE NA 70 MG TAB: 70 | 28 days supply | Qty: 4 | Fill #7

## 2016-02-08 MED FILL — ARMOUR THYROID 15 MG TABLET: 15 | 30 days supply | Qty: 30 | Fill #2

## 2016-02-08 MED FILL — ARMOUR THYROID 60 MG TABLET: 60 | 30 days supply | Qty: 30 | Fill #5

## 2016-02-16 DIAGNOSIS — Z1389 Encounter for screening for other disorder: Secondary | ICD-10-CM | POA: Diagnosis not present

## 2016-02-16 DIAGNOSIS — Z6824 Body mass index (BMI) 24.0-24.9, adult: Secondary | ICD-10-CM | POA: Diagnosis not present

## 2016-02-16 DIAGNOSIS — E039 Hypothyroidism, unspecified: Secondary | ICD-10-CM | POA: Diagnosis not present

## 2016-02-16 DIAGNOSIS — E785 Hyperlipidemia, unspecified: Secondary | ICD-10-CM | POA: Diagnosis not present

## 2016-02-16 DIAGNOSIS — Z79899 Other long term (current) drug therapy: Secondary | ICD-10-CM | POA: Diagnosis not present

## 2016-02-16 DIAGNOSIS — M810 Age-related osteoporosis without current pathological fracture: Secondary | ICD-10-CM | POA: Diagnosis not present

## 2016-02-16 DIAGNOSIS — Z9181 History of falling: Secondary | ICD-10-CM | POA: Diagnosis not present

## 2016-02-16 DIAGNOSIS — E559 Vitamin D deficiency, unspecified: Secondary | ICD-10-CM | POA: Diagnosis not present

## 2016-03-13 MED FILL — ARMOUR THYROID 15 MG TABLET: 15 | 30 days supply | Qty: 30 | Fill #3

## 2016-03-13 MED FILL — ALENDRONATE NA 70 MG TAB: 70 | 28 days supply | Qty: 4 | Fill #8

## 2016-03-13 MED FILL — ARMOUR THYROID 60 MG TABLET: 60 | 30 days supply | Qty: 30 | Fill #6

## 2016-04-05 DIAGNOSIS — Z1231 Encounter for screening mammogram for malignant neoplasm of breast: Secondary | ICD-10-CM | POA: Diagnosis not present

## 2016-04-09 MED FILL — ARMOUR THYROID 60 MG TABLET: 60 | 30 days supply | Qty: 30 | Fill #7

## 2016-04-09 MED FILL — ARMOUR THYROID 15 MG TABLET: 15 | 30 days supply | Qty: 30 | Fill #4

## 2016-04-09 MED FILL — ALENDRONATE NA 70 MG TAB: 70 | 28 days supply | Qty: 4 | Fill #9

## 2016-04-13 ENCOUNTER — Encounter: Payer: Self-pay | Admitting: Certified Nurse Midwife

## 2016-05-09 MED FILL — ARMOUR THYROID 60 MG TABLET: 60 | 30 days supply | Qty: 30 | Fill #8

## 2016-05-09 MED FILL — ALENDRONATE NA 70 MG TAB: 70 | 28 days supply | Qty: 4 | Fill #10

## 2016-05-09 MED FILL — ARMOUR THYROID 15 MG TABLET: 15 | 30 days supply | Qty: 30 | Fill #5

## 2016-06-07 DIAGNOSIS — R110 Nausea: Secondary | ICD-10-CM | POA: Diagnosis not present

## 2016-06-07 DIAGNOSIS — Z6823 Body mass index (BMI) 23.0-23.9, adult: Secondary | ICD-10-CM | POA: Diagnosis not present

## 2016-06-07 DIAGNOSIS — K2970 Gastritis, unspecified, without bleeding: Secondary | ICD-10-CM | POA: Diagnosis not present

## 2016-06-08 MED FILL — ARMOUR THYROID 60 MG TABLET: 60 | 30 days supply | Qty: 30 | Fill #9

## 2016-06-08 MED FILL — ALENDRONATE NA 70 MG TAB: 70 | 28 days supply | Qty: 4 | Fill #11

## 2016-06-08 MED FILL — ARMOUR THYROID 15 MG TABLET: 15 | 30 days supply | Qty: 30 | Fill #6

## 2016-06-12 DIAGNOSIS — Z6824 Body mass index (BMI) 24.0-24.9, adult: Secondary | ICD-10-CM | POA: Diagnosis not present

## 2016-06-12 DIAGNOSIS — R3915 Urgency of urination: Secondary | ICD-10-CM | POA: Diagnosis not present

## 2016-06-15 DIAGNOSIS — Z86010 Personal history of colonic polyps: Secondary | ICD-10-CM | POA: Diagnosis not present

## 2016-06-15 DIAGNOSIS — R109 Unspecified abdominal pain: Secondary | ICD-10-CM | POA: Diagnosis not present

## 2016-06-15 DIAGNOSIS — R141 Gas pain: Secondary | ICD-10-CM | POA: Diagnosis not present

## 2016-06-21 ENCOUNTER — Encounter: Payer: Self-pay | Admitting: Certified Nurse Midwife

## 2016-06-21 ENCOUNTER — Ambulatory Visit (INDEPENDENT_AMBULATORY_CARE_PROVIDER_SITE_OTHER): Payer: 59 | Admitting: Certified Nurse Midwife

## 2016-06-21 VITALS — BP 104/66 | HR 70 | Temp 98.7°F | Resp 16 | Ht 62.5 in | Wt 134.0 lb

## 2016-06-21 DIAGNOSIS — N952 Postmenopausal atrophic vaginitis: Secondary | ICD-10-CM

## 2016-06-21 DIAGNOSIS — N390 Urinary tract infection, site not specified: Secondary | ICD-10-CM

## 2016-06-21 LAB — POCT URINALYSIS DIPSTICK
Bilirubin, UA: NEGATIVE
Blood, UA: NEGATIVE
GLUCOSE UA: NEGATIVE
Ketones, UA: NEGATIVE
Leukocytes, UA: NEGATIVE
NITRITE UA: NEGATIVE
Protein, UA: NEGATIVE
UROBILINOGEN UA: NEGATIVE
pH, UA: 5

## 2016-06-21 MED ORDER — SULFAMETHOXAZOLE-TRIMETHOPRIM 800-160 MG PO TABS: 1.0000 | ORAL_TABLET | Freq: Two times a day (BID) | ORAL | 0 refills | Status: DC

## 2016-06-21 MED FILL — SULFAMETHOXAZOLE/TMP DS TAB: 800-160 | 5 days supply | Qty: 10 | Fill #0

## 2016-06-21 NOTE — Progress Notes (Signed)
60 y.o. Married Caucasian female [phone] here with complaint of UTI, with onset  on 3 - 5 days ago. Patient complaining of urinary frequency/urgency/ and pain with urination. Some cramping. Saw Dr. Collene Mares for IBS and now on medication for cramping and IBS. Also on probiotic also.  Patient denies fever, chills, nausea. slight  back pain but feel leftover from GI virus one week ago.. No new personal products. Patient feels not related to sexual activity. "Feels like I have cystitis, have had before". Had urine check 3 days ago with PCP, negative. Denies any vaginal symptoms, but has menopausal atrophic vaginitis. Was using coconut oil with good response, but has not used in recently.   . Patient is consuming adequate water intake. 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 Some stress incontinence and continues to work on [employer]. May follow up with Dr. Quincy Simmonds for evaluation if status changes. No other health issues today.   O: Healthy female WDWN Affect: Normal, orientation x 3 Skin : warm and dry POCT Urine;negative CVAT: Slight on left  Abdomen: positive for suprapubic tenderness, abdomen soft and bowel sounds noted with palpation  Pelvic exam: External genital area: normal, no lesions Bladder,Urethra tender, Urethral meatus: tender to touch, red Vagina:atrophic appearance, no moisture noted slightly tender with exam, no odor  Cervix: normal, non tender Uterus:normal,non tender Adnexa: normal non tender, no fullness or masses   A: UTI vs cystitis,previous history Atrophic vaginitis Normal pelvic exam   P: Reviewed findings of cystitis and need for treatment. [id number] DS see order with instructions. Discussed  Importance of water intake. Declines Pyridium, has some on hand Reviewed warning signs and symptoms of UTI and need to advise if occurring. Encouraged to limit soda, tea, and coffee and be sure to increase water intake. Discussed need to start back  with coconut oil to decrease vaginal dryness which increases risk of UTI. Does not want estrogen use. Patient will advise if no change in symptoms.   RV prn/ as above

## 2016-06-21 NOTE — Patient Instructions (Signed)

## 2016-06-25 NOTE — Progress Notes (Signed)
Encounter reviewed Jill Jertson, MD   

## 2016-07-10 MED FILL — ALENDRONATE NA 70 MG TAB: 70 | 28 days supply | Qty: 4 | Fill #0

## 2016-07-10 MED FILL — ARMOUR THYROID 15 MG TABLET: 15 | 30 days supply | Qty: 30 | Fill #0

## 2016-07-10 MED FILL — ARMOUR THYROID 60 MG TABLET: 60 | 30 days supply | Qty: 30 | Fill #10

## 2016-08-09 MED FILL — ALENDRONATE NA 70 MG TAB: 70 | 28 days supply | Qty: 4 | Fill #1

## 2016-08-09 MED FILL — ARMOUR THYROID 15 MG TABLET: 15 | 30 days supply | Qty: 30 | Fill #1

## 2016-08-09 MED FILL — ARMOUR THYROID 60 MG TABLET: 60 | 30 days supply | Qty: 30 | Fill #0 | Status: TO

## 2016-09-06 MED FILL — ALENDRONATE NA 70 MG TAB: 70 | 28 days supply | Qty: 4 | Fill #2

## 2016-09-06 MED FILL — ARMOUR THYROID 15 MG TABLET: 15 | 30 days supply | Qty: 30 | Fill #2

## 2016-09-20 DIAGNOSIS — Z0000 Encounter for general adult medical examination without abnormal findings: Secondary | ICD-10-CM | POA: Diagnosis not present

## 2016-09-20 DIAGNOSIS — E039 Hypothyroidism, unspecified: Secondary | ICD-10-CM | POA: Diagnosis not present

## 2016-09-20 DIAGNOSIS — E559 Vitamin D deficiency, unspecified: Secondary | ICD-10-CM | POA: Diagnosis not present

## 2016-09-20 DIAGNOSIS — Z6823 Body mass index (BMI) 23.0-23.9, adult: Secondary | ICD-10-CM | POA: Diagnosis not present

## 2016-09-20 DIAGNOSIS — R3915 Urgency of urination: Secondary | ICD-10-CM | POA: Diagnosis not present

## 2016-09-28 MED FILL — ALENDRONATE NA 70 MG TAB: 70 | 28 days supply | Qty: 4 | Fill #3

## 2016-10-12 MED FILL — ARMOUR THYROID 60 MG TABLET: 60 | 30 days supply | Qty: 30 | Fill #1 | Status: TO

## 2016-10-12 MED FILL — ARMOUR THYROID 15 MG TABLET: 15 | 30 days supply | Qty: 30 | Fill #3

## 2016-11-01 MED FILL — ALENDRONATE NA 70 MG TAB: 70 | 28 days supply | Qty: 4 | Fill #4

## 2016-11-06 MED FILL — ARMOUR THYROID 15 MG TABLET: 15 | 30 days supply | Qty: 30 | Fill #4

## 2016-11-20 DIAGNOSIS — H52213 Irregular astigmatism, bilateral: Secondary | ICD-10-CM | POA: Diagnosis not present

## 2016-11-28 MED FILL — ALENDRONATE NA 70 MG TAB: 70 | 28 days supply | Qty: 4 | Fill #5

## 2016-11-28 MED FILL — ARMOUR THYROID 60 MG TABLET: 60 | 30 days supply | Qty: 30 | Fill #2 | Status: TO

## 2016-12-13 MED FILL — ARMOUR THYROID 15 MG TABLET: 15 | 30 days supply | Qty: 30 | Fill #5

## 2016-12-14 ENCOUNTER — Ambulatory Visit: Payer: 59 | Admitting: Certified Nurse Midwife

## 2016-12-26 MED FILL — ALENDRONATE NA 70 MG TAB: 70 | 28 days supply | Qty: 4 | Fill #6

## 2016-12-26 MED FILL — ARMOUR THYROID 60 MG TABLET: 60 | 30 days supply | Qty: 30 | Fill #3 | Status: TO

## 2016-12-28 ENCOUNTER — Ambulatory Visit (INDEPENDENT_AMBULATORY_CARE_PROVIDER_SITE_OTHER): Payer: 59 | Admitting: Certified Nurse Midwife

## 2016-12-28 ENCOUNTER — Encounter: Payer: Self-pay | Admitting: Certified Nurse Midwife

## 2016-12-28 VITALS — BP 104/62 | HR 64 | Resp 16 | Ht 62.25 in | Wt 128.0 lb

## 2016-12-28 DIAGNOSIS — Z8739 Personal history of other diseases of the musculoskeletal system and connective tissue: Secondary | ICD-10-CM | POA: Diagnosis not present

## 2016-12-28 DIAGNOSIS — Z8619 Personal history of other infectious and parasitic diseases: Secondary | ICD-10-CM

## 2016-12-28 DIAGNOSIS — Z01419 Encounter for gynecological examination (general) (routine) without abnormal findings: Secondary | ICD-10-CM

## 2016-12-28 DIAGNOSIS — N6310 Unspecified lump in the right breast, unspecified quadrant: Secondary | ICD-10-CM

## 2016-12-28 DIAGNOSIS — Z1151 Encounter for screening for human papillomavirus (HPV): Secondary | ICD-10-CM | POA: Diagnosis not present

## 2016-12-28 DIAGNOSIS — Z124 Encounter for screening for malignant neoplasm of cervix: Secondary | ICD-10-CM | POA: Diagnosis not present

## 2016-12-28 DIAGNOSIS — Z8639 Personal history of other endocrine, nutritional and metabolic disease: Secondary | ICD-10-CM | POA: Diagnosis not present

## 2016-12-28 NOTE — Progress Notes (Signed)
Scheduled patient while in office for right breast diagnostic with ultrasound at [facility] for 01/04/2017 at 3 pm. Patient is agreeable to date and time. Placed in mammogram hold.

## 2016-12-28 NOTE — Patient Instructions (Signed)

## 2016-12-28 NOTE — Progress Notes (Signed)
61 y.o. [id number] Married  Caucasian Fe here for annual exam. Menopausal no HRT. Denies vaginal bleeding. Using coconut oil for vaginal dryness, with good result. Sees PCP yearly for aex and labs. Continues to see Dr. Chalmers Cater for osteoporosis and for Hypothyroid management. Now on Fosamax after Forteo use.. Continues to monitor lymph node in neck and feels no change.  Stress with changes at work and son going through separation.Marland Kitchen Has noted small area in right breast she is concerned about, non tender, no nipple discharge or skin change. No LS flares that she is aware. Screening labs as needed, has not had HCV RNA for this year. Planning retirement in June!  Patient's last menstrual period was 09/11/2005 (approximate).          Sexually active: Yes.    The current method of family planning is post menopausal status & BTL.    Exercising: No.  exercise Smoker:  no  Health Maintenance: Pap:  09/30/13 neg, 12-10-15 neg HPV HR neg History of Abnormal Pap: early 20's MMG:  04-05-16 category b density birads 1:neg Self Breast exams: yes Colonoscopy:  2015 f/u 84yrs BMD:   2015 TDaP:  2008 to get thru hospital  Shingles: never, declines Pneumonia: none Hep C and HIV: Hep c treated 2015, HIV neg in past Labs: as needed   reports that she has quit smoking. She has never used smokeless tobacco. She reports that she drinks alcohol. She reports that she does not use drugs.  Past Medical History:  Diagnosis Date  . Abnormal Pap smear of cervix    early 20's  . Hepatitis C   . Hydatidiform mole   . Hydatidiform mole    D&C done (age 32)  . Hypothyroidism   . Lichen sclerosus   . Osteoporosis   . Otalgia, unspecified   . Pain in joint, ankle and foot     Past Surgical History:  Procedure Laterality Date  . COLONOSCOPY  1/09   polyp removed  . CRYOTHERAPY     ? for abnormal pap  . CYSTECTOMY Right 1972   benign tumor removal rt arm  . DILATION AND CURETTAGE OF UTERUS    . EYE SURGERY    .  TONSILLECTOMY  1958  . TUBAL LIGATION     age 58    Current Outpatient Prescriptions  Medication Sig Dispense Refill  . alendronate (FOSAMAX) 70 MG tablet Take 70 mg by mouth once a week.     . Calcium 250 MG CAPS Take 3 tablets by mouth daily.    . Cholecalciferol (VITAMIN D) 2000 units CAPS Take 1 capsule by mouth daily.    . Magnesium 500 MG CAPS Take 1 capsule by mouth daily.    Marland Kitchen thyroid (ARMOUR) 15 MG tablet Take 15 mg by mouth daily.    Marland Kitchen thyroid (ARMOUR) 60 MG tablet Take 60 mg by mouth daily before breakfast.     No current facility-administered medications for this visit.     Family History  Problem Relation Age of Onset  . Ulcerative colitis Mother   . Arthritis Mother   . COPD Mother   . Hyperlipidemia Mother   . Osteoporosis Mother   . Alcohol abuse Father   . Liver disease Father   . Diverticulitis Father   . Heart disease Maternal Grandmother   . Hypertension Maternal Grandmother     ROS:  Pertinent items are noted in HPI.  Otherwise, a comprehensive ROS was negative.  Exam:   BP 104/62  Pulse 64   Resp 16   Ht 5' 2.25" (1.581 m)   Wt 128 lb (58.1 kg)   LMP 09/11/2005 (Approximate)   BMI 23.22 kg/m  Height: 5' 2.25" (158.1 cm) Ht Readings from Last 3 Encounters:  12/28/16 5' 2.25" (1.581 m)  06/21/16 5' 2.5" (1.588 m)  12/10/15 5' 2.5" (1.588 m)    General appearance: alert, cooperative and appears stated age Head: Normocephalic, without obvious abnormality, atraumatic Neck: no adenopathy, supple, symmetrical, trachea midline and thyroid normal to inspection and palpation Lungs: clear to auscultation bilaterally Breasts: normal appearance, no masses or tenderness, No nipple retraction or dimpling, No nipple discharge or bleeding, No axillary or supraclavicular adenopathy, positive findings: small mass noted in right breast at 11- 12 o'clock mobile, soft, ? fibroglandular tissue Heart: regular rate and rhythm Abdomen: soft, non-tender; no masses,   no organomegaly Extremities: extremities normal, atraumatic, no cyanosis or edema Skin: Skin color, texture, turgor normal. No rashes or lesions Lymph nodes: Cervical, supraclavicular, and axillary nodes normal. No abnormal inguinal nodes palpated Neurologic: Grossly normal   Pelvic: External genitalia:  no lesions              Urethra:  normal appearing urethra with no masses, tenderness or lesions              Bartholin's and Skene's: normal                 Vagina: normal appearing vagina with normal color and discharge, no lesions              Cervix: multiparous appearance, no bleeding following Pap, no cervical motion tenderness and no lesions              Pap taken: Yes.   Bimanual Exam:  Uterus:  normal size, contour, position, consistency, mobility, non-tender              Adnexa: normal adnexa and no mass, fullness, tenderness               Rectovaginal: Confirms               Anus:  normal sphincter tone, no lesions  Chaperone present: yes  A:  Well Woman with normal exam  Menopausal no HRT  Right breast mass  Osteoporosis/Hypothyroid with PCP management  History of LS no recent flares  Screening labs  Immunization update   P:   Reviewed health and wellness pertinent to exam  Aware to advise if vaginal bleeding  Discussed need to evaluate with diagnostic mammogram and [place], patient agreeable, will be scheduled before leaving today  Continue follow up with MD as indicated. 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

## 2016-12-30 LAB — HEPATITIS C RNA QUANTITATIVE
HCV QUANT LOG: NOT DETECTED {Log_IU}/mL
HCV Quantitative: <15 IU/mL

## 2016-12-30 NOTE — Progress Notes (Signed)
Encounter reviewed Jill Jertson, MD   

## 2017-01-03 LAB — IPS PAP TEST WITH HPV

## 2017-01-04 DIAGNOSIS — N6312 Unspecified lump in the right breast, upper inner quadrant: Secondary | ICD-10-CM | POA: Diagnosis not present

## 2017-01-09 DIAGNOSIS — E039 Hypothyroidism, unspecified: Secondary | ICD-10-CM | POA: Diagnosis not present

## 2017-01-15 ENCOUNTER — Other Ambulatory Visit (HOSPITAL_COMMUNITY): Payer: Self-pay | Admitting: Endocrinology

## 2017-01-15 DIAGNOSIS — E039 Hypothyroidism, unspecified: Secondary | ICD-10-CM | POA: Diagnosis not present

## 2017-01-15 DIAGNOSIS — R591 Generalized enlarged lymph nodes: Secondary | ICD-10-CM | POA: Diagnosis not present

## 2017-01-16 ENCOUNTER — Ambulatory Visit (HOSPITAL_COMMUNITY)
Admission: RE | Admit: 2017-01-16 | Discharge: 2017-01-16 | Disposition: A | Discharge status: Home / Self Care | Payer: 59 | Source: Ambulatory Visit | Attending: Endocrinology | Admitting: Endocrinology

## 2017-01-16 ENCOUNTER — Encounter (HOSPITAL_COMMUNITY): Payer: Self-pay

## 2017-01-16 DIAGNOSIS — R591 Generalized enlarged lymph nodes: Secondary | ICD-10-CM | POA: Insufficient documentation

## 2017-01-16 DIAGNOSIS — R599 Enlarged lymph nodes, unspecified: Secondary | ICD-10-CM | POA: Diagnosis not present

## 2017-01-16 MED ORDER — IOPAMIDOL (ISOVUE-300) INJECTION 61%
INTRAVENOUS | Status: AC
  Administered 2017-01-16: 75 mL
  Filled 2017-01-16: qty 75

## 2017-01-16 MED FILL — ARMOUR THYROID 15 MG TABLET: 15 | 30 days supply | Qty: 30 | Fill #0 | Status: TO

## 2017-01-17 MED FILL — ALENDRONATE NA 70 MG TAB: 70 | 28 days supply | Qty: 4 | Fill #7

## 2017-01-19 DIAGNOSIS — M810 Age-related osteoporosis without current pathological fracture: Secondary | ICD-10-CM | POA: Diagnosis not present

## 2017-01-19 DIAGNOSIS — M545 Low back pain: Secondary | ICD-10-CM | POA: Diagnosis not present

## 2017-01-19 DIAGNOSIS — M25551 Pain in right hip: Secondary | ICD-10-CM | POA: Diagnosis not present

## 2017-01-22 DIAGNOSIS — M810 Age-related osteoporosis without current pathological fracture: Secondary | ICD-10-CM | POA: Diagnosis not present

## 2017-01-22 DIAGNOSIS — D485 Neoplasm of uncertain behavior of skin: Secondary | ICD-10-CM | POA: Diagnosis not present

## 2017-01-22 DIAGNOSIS — D2239 Melanocytic nevi of other parts of face: Secondary | ICD-10-CM | POA: Diagnosis not present

## 2017-01-22 DIAGNOSIS — L820 Inflamed seborrheic keratosis: Secondary | ICD-10-CM | POA: Diagnosis not present

## 2017-01-22 DIAGNOSIS — L821 Other seborrheic keratosis: Secondary | ICD-10-CM | POA: Diagnosis not present

## 2017-01-22 DIAGNOSIS — D1801 Hemangioma of skin and subcutaneous tissue: Secondary | ICD-10-CM | POA: Diagnosis not present

## 2017-01-22 DIAGNOSIS — D225 Melanocytic nevi of trunk: Secondary | ICD-10-CM | POA: Diagnosis not present

## 2017-01-23 MED FILL — ARMOUR THYROID 60 MG TABLET: 60 | 30 days supply | Qty: 30 | Fill #4 | Status: TO

## 2017-01-29 IMAGING — US US ABDOMEN LIMITED
1 series · 14 of 25 positions shown · non-contrast
Comparison: 07/21/2013 and 05/25/2010 ultrasounds

CLINICAL DATA: 59-year-old female -followup gallbladder polyp.
History of hepatitis-C.

EXAM:
US ABDOMEN LIMITED - RIGHT UPPER QUADRANT

[Series 1: us abdomen limited · 0.15mm/px · 14 of 68 slices shown]
[im 1/68]
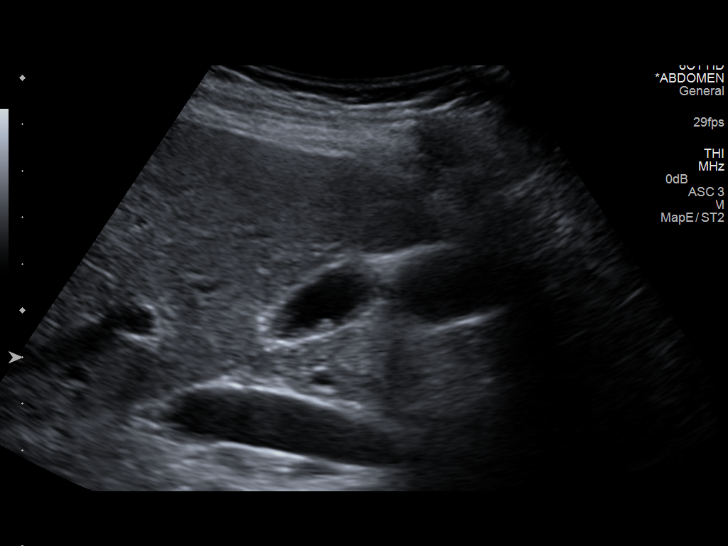
[im 6/68]
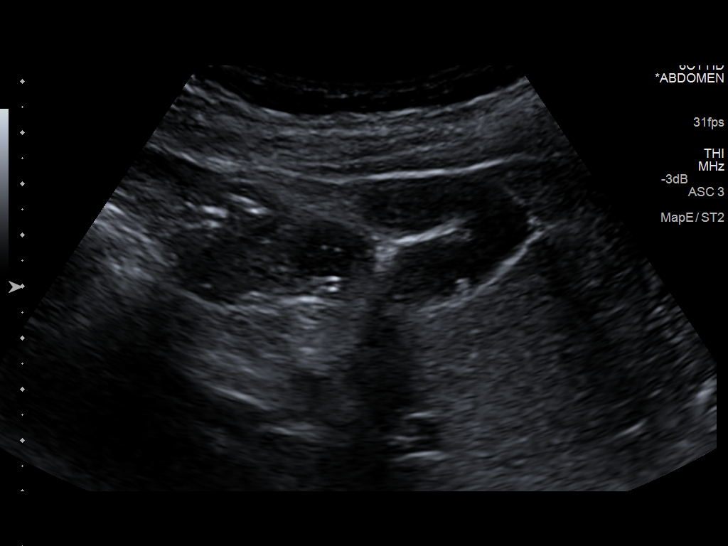
[im 12/68]
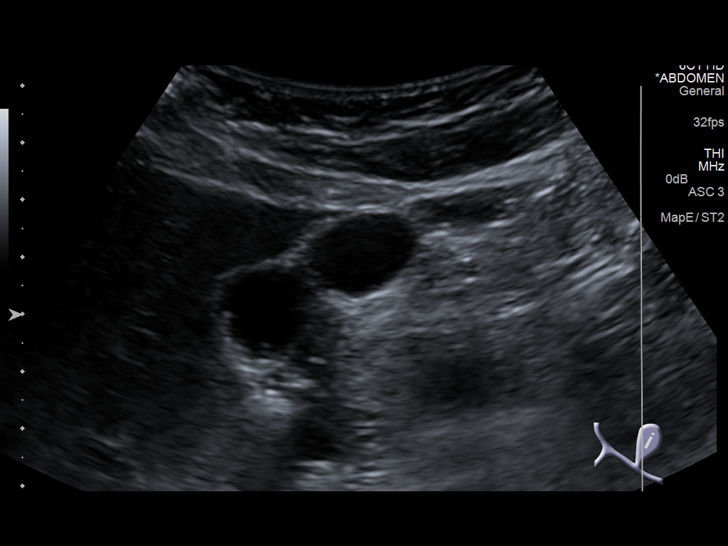
[im 17/68]
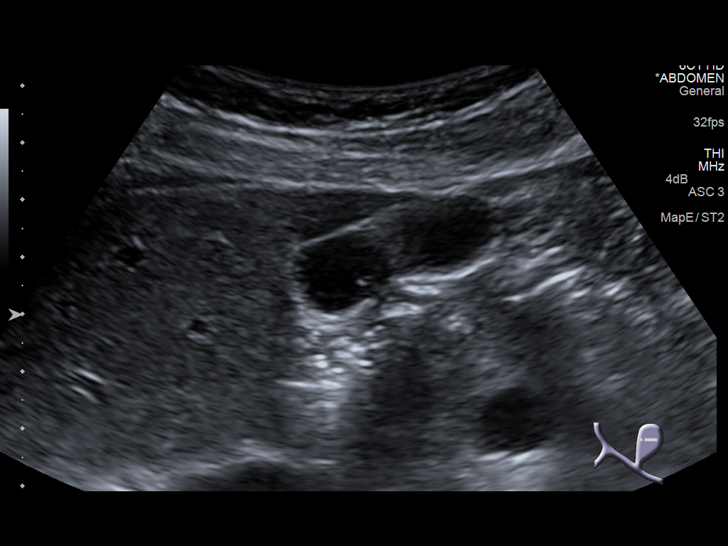
[im 23/68]
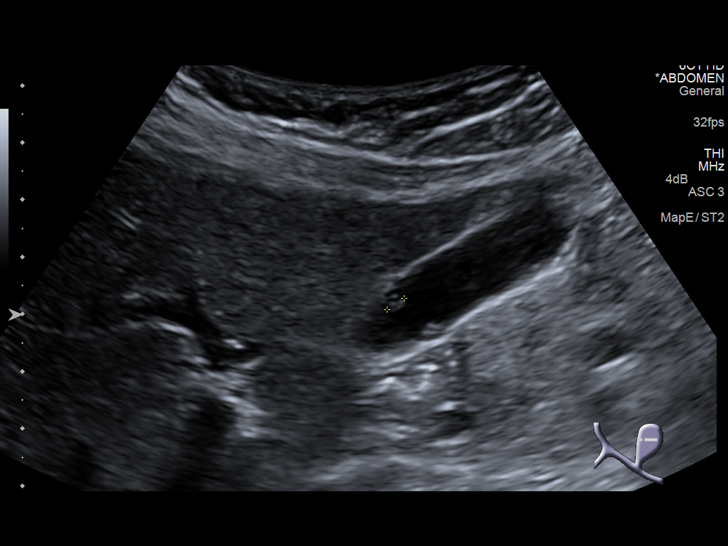
[im 26/68]
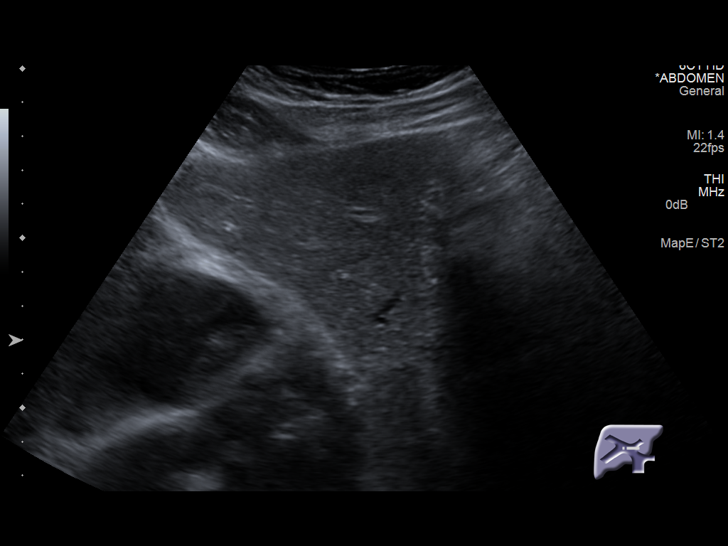
[im 31/68]
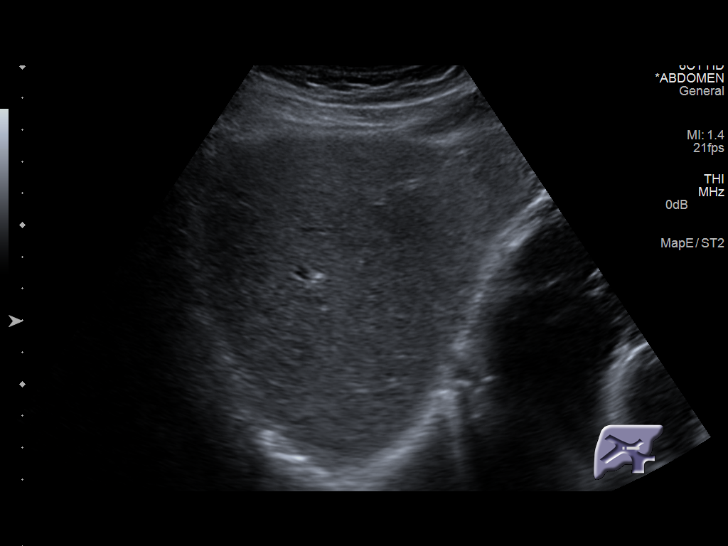
[im 37/68]
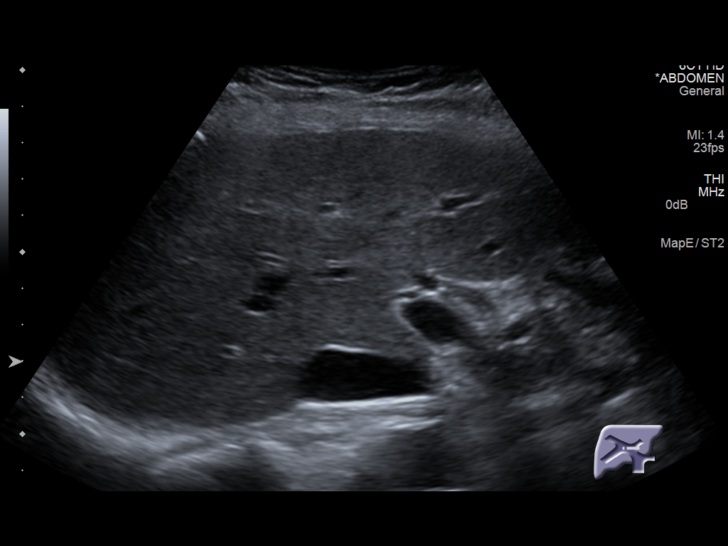
[im 42/68]
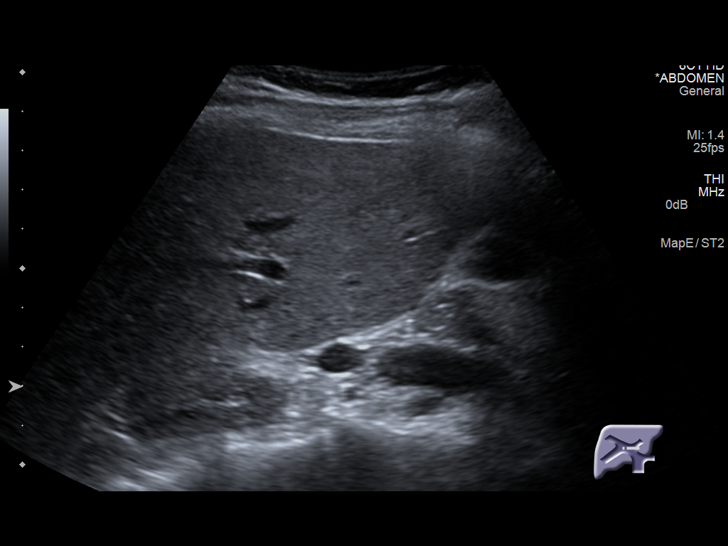
[im 45/68]
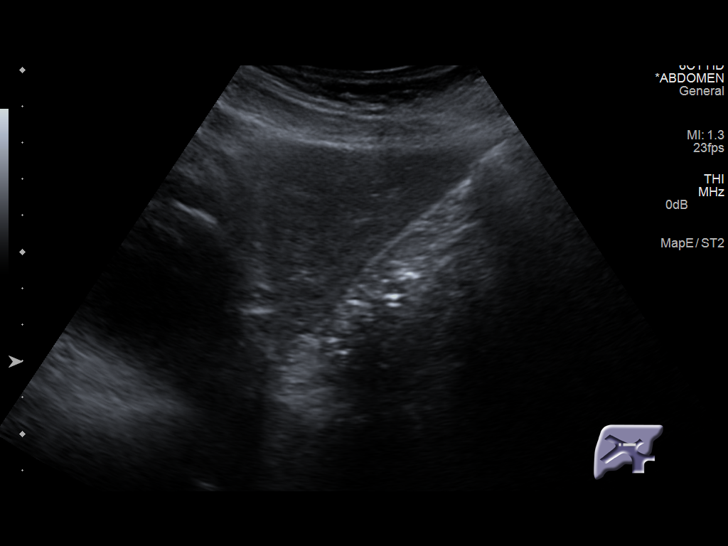
[im 51/68]
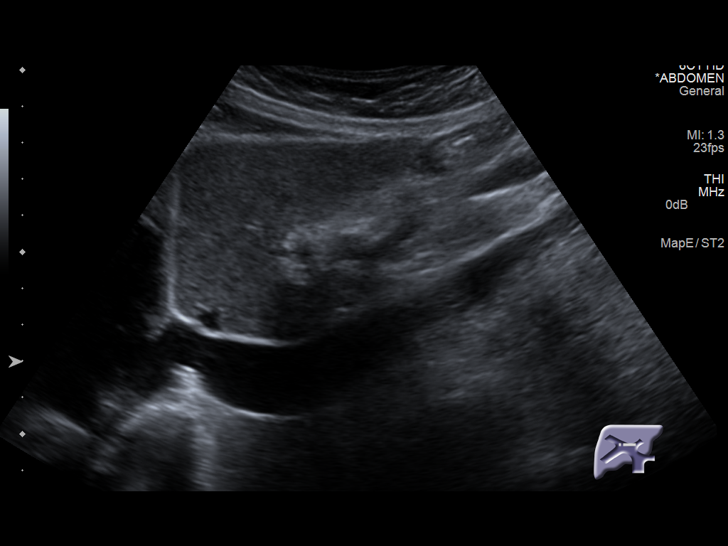
[im 56/68]
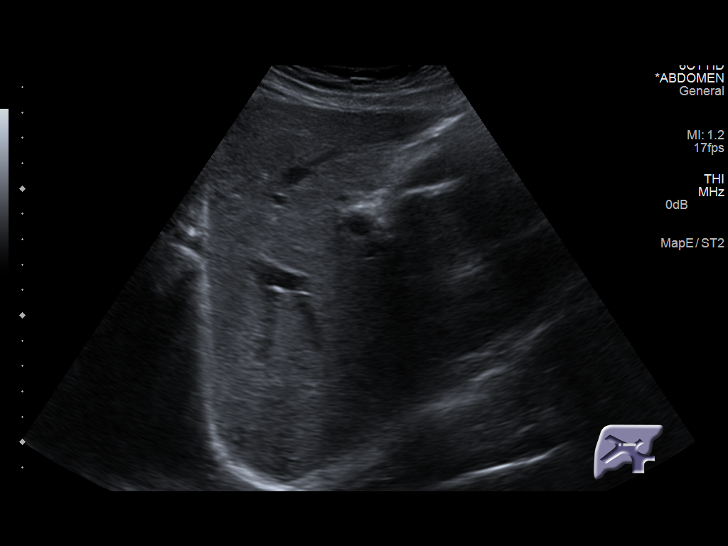
[im 62/68]
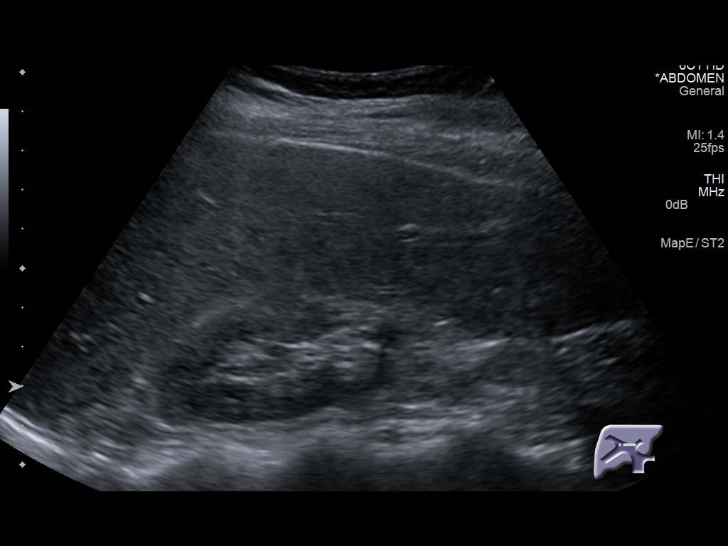
[im 68/68]
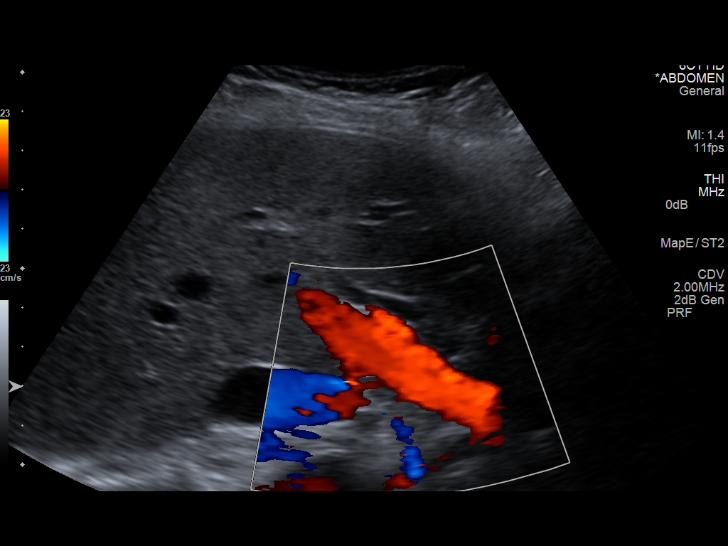

[14 of 25 positions shown; findings below may reference images not displayed]

FINDINGS: Gallbladder:

Small gallbladder polyps (at least 4) are again noted and unchanged
measuring 3-4 mm. There is no evidence of cholelithiasis or acute
cholecystitis.

Common bile duct:

Diameter: 6 mm. There is no evidence of intrahepatic or extrahepatic
biliary dilatation.

Liver:

No focal lesion identified. Within normal limits in parenchymal
echogenicity.
IMPRESSION: Unchanged small gallbladder polyps. No evidence of cholelithiasis,
acute cholecystitis or biliary dilatation.

## 2017-01-30 ENCOUNTER — Telehealth: Payer: Self-pay

## 2017-01-30 NOTE — Telephone Encounter (Signed)
Left message to call Farrell at [phone].  Need to evaluate to see if patient still feels lump in right breast/axilla area. If still present needs to be seen for recheck.

## 2017-01-31 NOTE — Telephone Encounter (Signed)
Spoke with patient. Patient states that she has been performing self breast exams and does not feel a change in the area felt in right breast. Had diagnostic imaging with an ultrasound of the area and does not wish to return for further recheck at this time. Advised to notify the office if she notices any changes to the area or has any concerns. Patient verbalizes understanding. Due to have bilateral screening mammogram in September 2018.  Routing to [employer] CNM for review. Okay to remove from hold?

## 2017-01-31 NOTE — Telephone Encounter (Signed)
Return call to Kaitlyn. °

## 2017-02-01 NOTE — Telephone Encounter (Signed)
Yes out hold will follow with next mammogram

## 2017-02-01 NOTE — Telephone Encounter (Signed)
Patient removed from mammogram hold. Routing to Nora for final review before closing.

## 2017-02-09 ENCOUNTER — Ambulatory Visit (INDEPENDENT_AMBULATORY_CARE_PROVIDER_SITE_OTHER): Payer: 59 | Admitting: Obstetrics & Gynecology

## 2017-02-09 VITALS — BP 102/72 | HR 60 | Resp 14 | Ht 62.25 in | Wt 129.0 lb

## 2017-02-09 DIAGNOSIS — N6011 Diffuse cystic mastopathy of right breast: Secondary | ICD-10-CM | POA: Diagnosis not present

## 2017-02-11 ENCOUNTER — Encounter: Payer: Self-pay | Admitting: Obstetrics & Gynecology

## 2017-02-11 NOTE — Progress Notes (Signed)
GYNECOLOGY  VISIT   HPI: 61 y.o. [id number] Married Caucasian female here for breast check.  Pt has area on right breast at 1-2 o'clock that she feels has been present at times during the past year.  She states she will feel the area, then start checking it again and again until it is sore and then it takes several weeks to improve.  Denies nipple discharge or skin changes.  Last MMG was 04/05/16.  Pt does have a little anxiety about this.  Pt has recent CT scan of neck due to "lymphadenopathy" but she reports there is nodular area on her neck that has been present for several years.  CT was negative.  Pt has been advised it may just be a prominent carotid sinus.  CT of lungs showed mild apical scarring bilaterally.  She has concerns about pulmonary fibrosis and the CT finding.  I am not an expert on this but the apical scarring is not consistent with pulmonary fibrosis and the honeycomb findings that are typical.  Reports she was advised this was "nothing to worry about".  GYNECOLOGIC HISTORY: Patient's last menstrual period was 09/11/2005 (approximate). Contraception: menopausal Menopausal hormone therapy: none  Patient Active Problem List   Diagnosis Date Noted  . Cough 04/16/2013  . Hip pain, chronic 04/16/2013  . Breast pain in female 04/16/2013  . Hepatitis C infection 02/02/2012  . Osteoporosis 09/12/2003  . Hypothyroid 09/11/1998    Past Medical History:  Diagnosis Date  . Abnormal Pap smear of cervix    early 20's  . Hepatitis C   . Hydatidiform mole   . Hydatidiform mole    D&C done (age 35)  . Hypothyroidism   . Lichen sclerosus   . Osteoporosis   . Otalgia, unspecified   . Pain in joint, ankle and foot     Past Surgical History:  Procedure Laterality Date  . COLONOSCOPY  1/09   polyp removed  . CRYOTHERAPY     ? for abnormal pap  . CYSTECTOMY Right 1972   benign tumor removal rt arm  . DILATION AND CURETTAGE OF UTERUS    . EYE SURGERY    . TONSILLECTOMY  1958    . TUBAL LIGATION     age 68    MEDS:  Reviewed in EPIC and UTD  ALLERGIES: Patient has no known allergies.  Family History  Problem Relation Age of Onset  . Ulcerative colitis Mother   . Arthritis Mother   . COPD Mother   . Hyperlipidemia Mother   . Osteoporosis Mother   . Alcohol abuse Father   . Liver disease Father   . Diverticulitis Father   . Heart disease Maternal Grandmother   . Hypertension Maternal Grandmother     SH:  Married, non smoker  Review of Systems  All other systems reviewed and are negative.   PHYSICAL EXAMINATION:    BP 102/72 (BP Location: Right Arm, Patient Position: Sitting, Cuff Size: Normal)   Pulse 60   Resp 14   Ht 5' 2.25" (1.581 m)   Wt 129 lb (58.5 kg)   LMP 09/11/2005 (Approximate)   BMI 23.41 kg/m     Physical Exam  Constitutional: She appears well-developed and well-nourished.  Neck: Normal range of motion. Neck supple. No tracheal deviation present. No thyromegaly present.  Nodule at  Location of carotid pulse on right side of neck noted.  Cardiovascular: Normal rate.   Respiratory: Effort normal and breath sounds normal. Right breast exhibits no inverted  nipple, no mass, no nipple discharge, no skin change and no tenderness. Left breast exhibits no inverted nipple, no mass, no nipple discharge, no skin change and no tenderness. Breasts are symmetrical.    Lymphadenopathy:    She has no cervical adenopathy.    Assessment: Fibroglandular breast tissue noted on mammogram No mass noted today  Plan: I do not think additional follow up breast exams need to be done except at routine exam.  As well, do not feel diagnostic MMG needed at this time.  Pt will have routing imaging in July and will plan to have a 3D at that time as well.  Pt is comfortable with this plan.   ~20 minutes spent with patient >50% of time was in face to face discussion of above.

## 2017-02-14 MED FILL — ARMOUR THYROID 15 MG TABLET: 15 | 30 days supply | Qty: 30 | Fill #1 | Status: TO

## 2017-02-14 MED FILL — ALENDRONATE NA 70 MG TAB: 70 | 28 days supply | Qty: 4 | Fill #8

## 2017-02-26 MED FILL — ARMOUR THYROID 60 MG TABLET: 60 | 30 days supply | Qty: 30 | Fill #5 | Status: TO

## 2017-03-13 MED FILL — ARMOUR THYROID 15 MG TABLET: 15 | 30 days supply | Qty: 30 | Fill #2 | Status: TO

## 2017-03-13 MED FILL — ALENDRONATE NA 70 MG TAB: 70 | 28 days supply | Qty: 4 | Fill #9

## 2017-04-18 MED FILL — ALENDRONATE NA 70 MG TAB: 70 | 28 days supply | Qty: 4 | Fill #10

## 2017-04-18 MED FILL — ARMOUR THYROID 60 MG TABLET: 60 | 30 days supply | Qty: 30 | Fill #0 | Status: TO

## 2017-05-16 MED FILL — ALENDRONATE NA 70 MG TAB: 70 | 28 days supply | Qty: 4 | Fill #11

## 2017-06-06 DIAGNOSIS — Z1231 Encounter for screening mammogram for malignant neoplasm of breast: Secondary | ICD-10-CM | POA: Diagnosis not present

## 2017-08-08 DIAGNOSIS — M25552 Pain in left hip: Secondary | ICD-10-CM | POA: Diagnosis not present

## 2017-08-08 DIAGNOSIS — M25551 Pain in right hip: Secondary | ICD-10-CM | POA: Diagnosis not present

## 2017-08-08 DIAGNOSIS — M546 Pain in thoracic spine: Secondary | ICD-10-CM | POA: Diagnosis not present

## 2017-08-08 DIAGNOSIS — E559 Vitamin D deficiency, unspecified: Secondary | ICD-10-CM | POA: Diagnosis not present

## 2017-08-08 DIAGNOSIS — M810 Age-related osteoporosis without current pathological fracture: Secondary | ICD-10-CM | POA: Diagnosis not present

## 2017-08-08 DIAGNOSIS — R5383 Other fatigue: Secondary | ICD-10-CM | POA: Diagnosis not present

## 2017-08-08 MED FILL — ALENDRONATE NA 70 MG TAB: 70 | 28 days supply | Qty: 4 | Fill #0

## 2017-08-27 DIAGNOSIS — M25511 Pain in right shoulder: Secondary | ICD-10-CM | POA: Diagnosis not present

## 2017-08-27 DIAGNOSIS — M25512 Pain in left shoulder: Secondary | ICD-10-CM | POA: Diagnosis not present

## 2017-08-27 DIAGNOSIS — M546 Pain in thoracic spine: Secondary | ICD-10-CM | POA: Diagnosis not present

## 2017-08-27 DIAGNOSIS — M25551 Pain in right hip: Secondary | ICD-10-CM | POA: Diagnosis not present

## 2017-08-31 MED FILL — ALENDRONATE NA 70 MG TAB: 70 | 28 days supply | Qty: 4 | Fill #1 | Status: TO

## 2017-09-05 DIAGNOSIS — M4010 Other secondary kyphosis, site unspecified: Secondary | ICD-10-CM | POA: Diagnosis not present

## 2017-09-05 DIAGNOSIS — M818 Other osteoporosis without current pathological fracture: Secondary | ICD-10-CM | POA: Diagnosis not present

## 2017-09-10 DIAGNOSIS — M25511 Pain in right shoulder: Secondary | ICD-10-CM | POA: Diagnosis not present

## 2017-09-10 DIAGNOSIS — M25512 Pain in left shoulder: Secondary | ICD-10-CM | POA: Diagnosis not present

## 2017-09-10 DIAGNOSIS — M546 Pain in thoracic spine: Secondary | ICD-10-CM | POA: Diagnosis not present

## 2017-09-10 DIAGNOSIS — M25551 Pain in right hip: Secondary | ICD-10-CM | POA: Diagnosis not present

## 2017-10-11 DIAGNOSIS — I498 Other specified cardiac arrhythmias: Secondary | ICD-10-CM | POA: Diagnosis not present

## 2017-10-11 DIAGNOSIS — E039 Hypothyroidism, unspecified: Secondary | ICD-10-CM | POA: Diagnosis not present

## 2017-10-11 DIAGNOSIS — Z6823 Body mass index (BMI) 23.0-23.9, adult: Secondary | ICD-10-CM | POA: Diagnosis not present

## 2018-01-01 ENCOUNTER — Ambulatory Visit: Payer: 59 | Admitting: Certified Nurse Midwife

## 2018-05-09 DIAGNOSIS — Z6823 Body mass index (BMI) 23.0-23.9, adult: Secondary | ICD-10-CM | POA: Diagnosis not present

## 2018-05-09 DIAGNOSIS — E559 Vitamin D deficiency, unspecified: Secondary | ICD-10-CM | POA: Diagnosis not present

## 2018-05-09 DIAGNOSIS — B1920 Unspecified viral hepatitis C without hepatic coma: Secondary | ICD-10-CM | POA: Diagnosis not present

## 2018-05-09 DIAGNOSIS — Z0000 Encounter for general adult medical examination without abnormal findings: Secondary | ICD-10-CM | POA: Diagnosis not present

## 2018-05-21 DIAGNOSIS — E039 Hypothyroidism, unspecified: Secondary | ICD-10-CM | POA: Diagnosis not present

## 2018-06-07 DIAGNOSIS — Z1231 Encounter for screening mammogram for malignant neoplasm of breast: Secondary | ICD-10-CM | POA: Diagnosis not present

## 2018-06-17 ENCOUNTER — Encounter: Payer: Self-pay | Admitting: Certified Nurse Midwife

## 2018-07-16 DIAGNOSIS — Z6823 Body mass index (BMI) 23.0-23.9, adult: Secondary | ICD-10-CM | POA: Diagnosis not present

## 2018-07-16 DIAGNOSIS — M2550 Pain in unspecified joint: Secondary | ICD-10-CM | POA: Diagnosis not present

## 2018-07-16 DIAGNOSIS — Z1331 Encounter for screening for depression: Secondary | ICD-10-CM | POA: Diagnosis not present

## 2018-07-29 DIAGNOSIS — M79642 Pain in left hand: Secondary | ICD-10-CM | POA: Diagnosis not present

## 2018-07-29 DIAGNOSIS — M79641 Pain in right hand: Secondary | ICD-10-CM | POA: Diagnosis not present

## 2018-07-29 DIAGNOSIS — M2550 Pain in unspecified joint: Secondary | ICD-10-CM | POA: Diagnosis not present

## 2018-07-29 DIAGNOSIS — R5382 Chronic fatigue, unspecified: Secondary | ICD-10-CM | POA: Diagnosis not present

## 2018-11-14 DIAGNOSIS — Z86010 Personal history of colonic polyps: Secondary | ICD-10-CM | POA: Diagnosis not present

## 2018-11-14 DIAGNOSIS — K5900 Constipation, unspecified: Secondary | ICD-10-CM | POA: Diagnosis not present

## 2018-11-14 DIAGNOSIS — Z1211 Encounter for screening for malignant neoplasm of colon: Secondary | ICD-10-CM | POA: Diagnosis not present

## 2018-11-19 ENCOUNTER — Other Ambulatory Visit: Payer: Self-pay | Admitting: Gastroenterology

## 2018-11-19 DIAGNOSIS — K824 Cholesterolosis of gallbladder: Secondary | ICD-10-CM

## 2018-11-26 ENCOUNTER — Other Ambulatory Visit: Payer: Self-pay

## 2018-11-26 ENCOUNTER — Ambulatory Visit
Admission: RE | Admit: 2018-11-26 | Discharge: 2018-11-26 | Disposition: A | Discharge status: Home / Self Care | Payer: PPO | Source: Ambulatory Visit | Attending: Gastroenterology | Admitting: Gastroenterology

## 2018-11-26 DIAGNOSIS — K824 Cholesterolosis of gallbladder: Secondary | ICD-10-CM

## 2018-11-27 ENCOUNTER — Encounter: Payer: Self-pay | Admitting: Certified Nurse Midwife

## 2018-11-27 DIAGNOSIS — Z1211 Encounter for screening for malignant neoplasm of colon: Secondary | ICD-10-CM | POA: Diagnosis not present

## 2018-11-27 DIAGNOSIS — K635 Polyp of colon: Secondary | ICD-10-CM | POA: Diagnosis not present

## 2018-11-27 DIAGNOSIS — K5140 Inflammatory polyps of colon without complications: Secondary | ICD-10-CM | POA: Diagnosis not present

## 2018-11-27 DIAGNOSIS — D123 Benign neoplasm of transverse colon: Secondary | ICD-10-CM | POA: Diagnosis not present

## 2018-11-27 DIAGNOSIS — Z86010 Personal history of colonic polyps: Secondary | ICD-10-CM | POA: Diagnosis not present

## 2018-12-09 DIAGNOSIS — M720 Palmar fascial fibromatosis [Dupuytren]: Secondary | ICD-10-CM | POA: Diagnosis not present

## 2018-12-09 DIAGNOSIS — M79642 Pain in left hand: Secondary | ICD-10-CM | POA: Diagnosis not present

## 2018-12-09 DIAGNOSIS — M79641 Pain in right hand: Secondary | ICD-10-CM | POA: Diagnosis not present

## 2018-12-09 DIAGNOSIS — M25519 Pain in unspecified shoulder: Secondary | ICD-10-CM | POA: Diagnosis not present

## 2018-12-18 DIAGNOSIS — I10 Essential (primary) hypertension: Secondary | ICD-10-CM | POA: Diagnosis not present

## 2018-12-18 DIAGNOSIS — J3089 Other allergic rhinitis: Secondary | ICD-10-CM | POA: Diagnosis not present

## 2018-12-18 DIAGNOSIS — E785 Hyperlipidemia, unspecified: Secondary | ICD-10-CM | POA: Diagnosis not present

## 2018-12-18 DIAGNOSIS — Z01810 Encounter for preprocedural cardiovascular examination: Secondary | ICD-10-CM | POA: Diagnosis not present

## 2018-12-18 DIAGNOSIS — J449 Chronic obstructive pulmonary disease, unspecified: Secondary | ICD-10-CM | POA: Diagnosis not present

## 2018-12-18 DIAGNOSIS — R002 Palpitations: Secondary | ICD-10-CM | POA: Diagnosis not present

## 2018-12-18 DIAGNOSIS — E118 Type 2 diabetes mellitus with unspecified complications: Secondary | ICD-10-CM | POA: Diagnosis not present

## 2018-12-18 DIAGNOSIS — I2510 Atherosclerotic heart disease of native coronary artery without angina pectoris: Secondary | ICD-10-CM | POA: Diagnosis not present

## 2018-12-18 DIAGNOSIS — R05 Cough: Secondary | ICD-10-CM | POA: Diagnosis not present

## 2018-12-23 DIAGNOSIS — R002 Palpitations: Secondary | ICD-10-CM | POA: Diagnosis not present

## 2018-12-26 DIAGNOSIS — R002 Palpitations: Secondary | ICD-10-CM | POA: Diagnosis not present

## 2019-01-08 ENCOUNTER — Other Ambulatory Visit: Payer: Self-pay | Admitting: *Deleted

## 2019-01-13 ENCOUNTER — Encounter: Payer: Self-pay | Admitting: *Deleted

## 2019-01-15 ENCOUNTER — Ambulatory Visit: Payer: Self-pay | Admitting: *Deleted

## 2019-01-16 ENCOUNTER — Other Ambulatory Visit: Payer: Self-pay | Admitting: *Deleted

## 2019-01-16 NOTE — Patient Outreach (Signed)
[facility]) Care Management  01/16/2019  Brianna Greene 1956-02-18 [id number]   CSW mailed patient a [place] Management Patient Welcome Letter, including a [facility] magnet and pamphlet.  CSW also mailed patient a Patient Programmer, multimedia, including an [place] documents, as well as "Advanced Directives" EMMI information, per the request of patient's health risk assessment screening.  CSW will outreach to patient again in 6 month, on Thursday, July 17, 2019, around 9:00AM, to ensure that patient received the information, as well as answer any questions that patient may have and/or offer assistance with completion.  Nat Christen, BSW, MSW, LCSW  Licensed Education officer, [facility]  Mailing [place] [address] [place], [place] [address] Physical Address-[address], [place], [place] [address] Toll Free Main # [phone] Fax # [phone] Cell # [phone]  Office # [phone] [email or web address]

## 2019-01-23 ENCOUNTER — Ambulatory Visit: Payer: Self-pay | Admitting: *Deleted

## 2019-02-27 ENCOUNTER — Other Ambulatory Visit: Payer: Self-pay | Admitting: *Deleted

## 2019-02-27 NOTE — Patient Outreach (Signed)
[facility]) Care Management  02/27/2019  Brianna Greene 1956/09/01 [id number]   Information provided to patient:  "I want to inform you that [employer] has partnered with [place] ([facility]).  A new care manager or health coach will be in touch with you in the next few weeks to continue working with you and your providers in achieving your health goals."  Your new care manager is aware of your request to complete your Advanced Directives (Living Will and [place] documents), which have already been mailed to your home for your convenience.    "Your new care manager will be Donne Hazel (RN)/[facility]), with whom I will be discussing your health conditions, current care plan, and the progress you have made so far in this program.   You will be receiving a welcome letter in the mail with further details of the [facility]'s complex care and condition management programs. For more information on the [facility], please visit [email or web address] or call toll free number [phone]."  Nat Christen, BSW, MSW, [place]  Licensed Clinical Social Worker  [place]  Mailing [place] [place], [place] [address] Physical Address-[address], [place], [place] [address] Toll Free Main # [phone] Fax # [phone] Cell # [phone]  Office # [phone] [email or web address]

## 2019-03-11 DIAGNOSIS — M25561 Pain in right knee: Secondary | ICD-10-CM | POA: Diagnosis not present

## 2019-03-11 DIAGNOSIS — M79641 Pain in right hand: Secondary | ICD-10-CM | POA: Diagnosis not present

## 2019-03-11 DIAGNOSIS — M810 Age-related osteoporosis without current pathological fracture: Secondary | ICD-10-CM | POA: Diagnosis not present

## 2019-03-11 DIAGNOSIS — M25562 Pain in left knee: Secondary | ICD-10-CM | POA: Diagnosis not present

## 2019-03-12 ENCOUNTER — Telehealth: Payer: Self-pay | Admitting: Oncology

## 2019-03-12 NOTE — Telephone Encounter (Signed)
Returned patient's phone call regarding scheduling an appointment, patient hasn't been here since 2013 so I have sent this request to Seth Bake on how to schedule this.

## 2019-04-14 ENCOUNTER — Other Ambulatory Visit: Payer: Self-pay

## 2019-04-15 DIAGNOSIS — F488 Other specified nonpsychotic mental disorders: Secondary | ICD-10-CM | POA: Diagnosis not present

## 2019-04-15 DIAGNOSIS — E039 Hypothyroidism, unspecified: Secondary | ICD-10-CM | POA: Diagnosis not present

## 2019-04-15 DIAGNOSIS — R52 Pain, unspecified: Secondary | ICD-10-CM | POA: Diagnosis not present

## 2019-05-30 DIAGNOSIS — M62838 Other muscle spasm: Secondary | ICD-10-CM | POA: Diagnosis not present

## 2019-05-30 DIAGNOSIS — E039 Hypothyroidism, unspecified: Secondary | ICD-10-CM | POA: Diagnosis not present

## 2019-06-20 ENCOUNTER — Encounter: Payer: Self-pay | Admitting: Certified Nurse Midwife

## 2019-06-20 DIAGNOSIS — M810 Age-related osteoporosis without current pathological fracture: Secondary | ICD-10-CM | POA: Diagnosis not present

## 2019-06-20 DIAGNOSIS — R5383 Other fatigue: Secondary | ICD-10-CM | POA: Diagnosis not present

## 2019-06-20 DIAGNOSIS — Z8262 Family history of osteoporosis: Secondary | ICD-10-CM | POA: Diagnosis not present

## 2019-06-20 DIAGNOSIS — D472 Monoclonal gammopathy: Secondary | ICD-10-CM | POA: Diagnosis not present

## 2019-06-20 DIAGNOSIS — Z1231 Encounter for screening mammogram for malignant neoplasm of breast: Secondary | ICD-10-CM | POA: Diagnosis not present

## 2019-06-30 ENCOUNTER — Telehealth: Payer: Self-pay | Admitting: Oncology

## 2019-06-30 NOTE — Telephone Encounter (Signed)
Received a new patient referral from Raliegh Ip for Ms. 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 MS. Siron has been cld and scheduled to see Dr. Alen Blew on 11/6 at 11am. She's been made aware to arrive 15 minutes early.

## 2019-07-03 DIAGNOSIS — H1012 Acute atopic conjunctivitis, left eye: Secondary | ICD-10-CM | POA: Diagnosis not present

## 2019-07-03 DIAGNOSIS — H01025 Squamous blepharitis left lower eyelid: Secondary | ICD-10-CM | POA: Diagnosis not present

## 2019-07-03 DIAGNOSIS — H01024 Squamous blepharitis left upper eyelid: Secondary | ICD-10-CM | POA: Diagnosis not present

## 2019-07-03 DIAGNOSIS — H18452 Nodular corneal degeneration, left eye: Secondary | ICD-10-CM | POA: Diagnosis not present

## 2019-07-15 DIAGNOSIS — M6281 Muscle weakness (generalized): Secondary | ICD-10-CM | POA: Diagnosis not present

## 2019-07-15 DIAGNOSIS — E039 Hypothyroidism, unspecified: Secondary | ICD-10-CM | POA: Diagnosis not present

## 2019-07-15 DIAGNOSIS — Z23 Encounter for immunization: Secondary | ICD-10-CM | POA: Diagnosis not present

## 2019-07-15 DIAGNOSIS — I471 Supraventricular tachycardia: Secondary | ICD-10-CM | POA: Diagnosis not present

## 2019-07-15 DIAGNOSIS — Z0000 Encounter for general adult medical examination without abnormal findings: Secondary | ICD-10-CM | POA: Diagnosis not present

## 2019-07-15 DIAGNOSIS — N393 Stress incontinence (female) (male): Secondary | ICD-10-CM | POA: Diagnosis not present

## 2019-07-15 DIAGNOSIS — Z87891 Personal history of nicotine dependence: Secondary | ICD-10-CM | POA: Diagnosis not present

## 2019-07-15 DIAGNOSIS — N952 Postmenopausal atrophic vaginitis: Secondary | ICD-10-CM | POA: Diagnosis not present

## 2019-07-17 ENCOUNTER — Ambulatory Visit: Payer: Self-pay | Admitting: *Deleted

## 2019-07-18 ENCOUNTER — Inpatient Hospital Stay: Payer: PPO | Attending: Oncology | Admitting: Oncology

## 2019-07-18 ENCOUNTER — Other Ambulatory Visit: Payer: Self-pay

## 2019-07-18 VITALS — BP 127/74 | HR 62 | Temp 98.5°F | Resp 17 | Ht 62.25 in | Wt 123.2 lb

## 2019-07-18 DIAGNOSIS — M810 Age-related osteoporosis without current pathological fracture: Secondary | ICD-10-CM | POA: Insufficient documentation

## 2019-07-18 DIAGNOSIS — D472 Monoclonal gammopathy: Secondary | ICD-10-CM | POA: Insufficient documentation

## 2019-07-18 DIAGNOSIS — Z79899 Other long term (current) drug therapy: Secondary | ICD-10-CM | POA: Insufficient documentation

## 2019-07-18 NOTE — Progress Notes (Signed)
Hematology and Oncology Follow Up Visit  Brianna Greene [id number] [birth year] 63 y.o. 07/18/2019 10:38 AM   Principle Diagnosis: 63 year old female with MGUS diagnosed in 2011.  She presented with M spike around 0.7 gm/dL, IgG level is 1960 with immunofixation showed an IgG kappa subtype.  No evidence of multiple myeloma noted.  Current therapy: Active surveillance.   Interim History:  Ms. Brianna Greene returns today for a repeat evaluation.  She is a pleasant 63 year old woman I have seen in the past for a monoclonal gammopathy without any evidence of plasma cell disorder.  Her last serum protein electrophoresis in 2013 showed an M spike of around 0.5 g/dL and IgG level of 1600.  She had a repeat the serum protein electrophoresis in October 2020 which showed a total protein of 7.8, albumin of 4.6 with an M spike of around 1 g/dL and immunofixation of IgG kappa.  Clinically, she reports no major clinical changes.  She continues to have issues with osteoporosis as well as history of hepatitis C that has been treated.  She denies any recent hospitalization illnesses.  She denies any pathological fractures or recurrent infections.   She denied headaches, blurry vision, syncope or seizures.  Denies any fevers, chills or sweats.  Denied chest pain, palpitation, orthopnea or leg edema.  Denied cough, wheezing or hemoptysis.  Denied nausea, vomiting or abdominal pain.  Denies any constipation or diarrhea.  Denies any frequency urgency or hesitancy.  Denies any arthralgias or myalgias.  Denies any skin rashes or lesions.  Denies any bleeding or clotting tendency.  Denies any easy bruising.  Denies any hair or nail changes.  Denies any anxiety or depression.  Remaining review of system is negative.   Medications: Reviewed and updated.  Current Outpatient Medications:  .  alendronate (FOSAMAX) 70 MG tablet, Take 70 mg by mouth once a week. , Disp: , Rfl:  .  Calcium 250 MG CAPS, Take 3 tablets by mouth daily.,  Disp: , Rfl:  .  Cholecalciferol (VITAMIN D) 2000 units CAPS, Take 1 capsule by mouth daily., Disp: , Rfl:  .  Magnesium 500 MG CAPS, Take 1 capsule by mouth daily., Disp: , Rfl:  .  thyroid (ARMOUR) 15 MG tablet, Take 15 mg by mouth daily., Disp: , Rfl:  .  thyroid (ARMOUR) 60 MG tablet, Take 60 mg by mouth daily before breakfast., Disp: , Rfl:   Allergies: No Known Allergies  Past Medical History, Surgical history, Social history, and Family History unchanged on review.   Physical Exam:    ECOG: 0    General appearance: Comfortable appearing without any discomfort Head: Normocephalic without any trauma Oropharynx: Mucous membranes are moist and pink without any thrush or ulcers. Eyes: Pupils are equal and round reactive to light. Lymph nodes: No cervical, supraclavicular, inguinal or axillary lymphadenopathy.   Heart:regular rate and rhythm.  S1 and S2 without leg edema. Lung: Clear without any rhonchi or wheezes.  No dullness to percussion. Abdomin: Soft, nontender, nondistended with good bowel sounds.  No hepatosplenomegaly. Musculoskeletal: No joint deformity or effusion.  Full range of motion noted. Neurological: No deficits noted on motor, sensory and deep tendon reflex exam. Skin: No petechial rash or dryness.  Appeared moist.     Lab Results: Lab Results  Component Value Date   WBC 8.3 04/13/2015   HGB 13.8 04/13/2015   HCT 41.5 04/13/2015   MCV 89.1 04/13/2015   PLT 270 04/13/2015     Chemistry  Component Value Date/Time   NA 140 11/10/2014 0830   K 3.9 11/10/2014 0830   CL 105 11/10/2014 0830   CO2 30 11/10/2014 0830   BUN 18 11/10/2014 0830   CREATININE 0.74 11/10/2014 0830      Component Value Date/Time   CALCIUM 9.1 11/10/2014 0830   ALKPHOS 69 11/10/2014 0830   AST 23 11/10/2014 0830   ALT 20 11/10/2014 0830   BILITOT 0.9 11/10/2014 339      63 year old woman with:  1.  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   At that time her M spike was around 0.7 g/dL and elevated IgG level at 1960.  She was monitored for 2 years and her M spike as well as IgG level is actually declined in 2013.  A repeat serum protein electrophoresis in 2020 showed no dramatic changes at this time with an M spike of around 1 g/dL.  The natural course of this disease as well as the differential diagnosis was discussed today.  Reactive monoclonal protein findings versus monoclonal gammopathy of undetermined significance.  Reactive findings related to possibly autoimmune disease, hepatitis C among other causes.  I see no evidence to suggest endorgan damage and multiple myeloma.  Laboratory data in the last 6 months including CBC, chemistries, calcium, kidney function and total protein all within normal range without any evidence to suggest endorgan damage.  I recommended active surveillance and repeat studies in 1 year.  2.  Osteoporosis: Unrelated to plasma cell disorder.  She is under consideration for additional therapy including Prolia.  3.  Follow-up: We will be in 1 year for repeat evaluation.   25  minutes was spent with the patient face-to-face today.  More than 50% of time was spent on reviewing laboratory data for the last 10 years, differential diagnosis, management options and future plan of care.    Zola Button, MD 11/6/202010:38 AM

## 2019-07-21 ENCOUNTER — Telehealth: Payer: Self-pay | Admitting: Oncology

## 2019-07-21 NOTE — Telephone Encounter (Signed)
Scheduled appt per 11/6 los.  Spoke with pt and she is aware of her appt date and time.

## 2019-07-23 DIAGNOSIS — M6281 Muscle weakness (generalized): Secondary | ICD-10-CM | POA: Diagnosis not present

## 2019-07-23 DIAGNOSIS — M62838 Other muscle spasm: Secondary | ICD-10-CM | POA: Diagnosis not present

## 2019-07-30 DIAGNOSIS — M810 Age-related osteoporosis without current pathological fracture: Secondary | ICD-10-CM | POA: Diagnosis not present

## 2019-08-12 DIAGNOSIS — M6281 Muscle weakness (generalized): Secondary | ICD-10-CM | POA: Diagnosis not present

## 2019-08-12 DIAGNOSIS — M62838 Other muscle spasm: Secondary | ICD-10-CM | POA: Diagnosis not present

## 2019-08-12 DIAGNOSIS — M50221 Other cervical disc displacement at C4-C5 level: Secondary | ICD-10-CM | POA: Diagnosis not present

## 2019-08-12 DIAGNOSIS — R531 Weakness: Secondary | ICD-10-CM | POA: Diagnosis not present

## 2019-08-13 DIAGNOSIS — Z6822 Body mass index (BMI) 22.0-22.9, adult: Secondary | ICD-10-CM | POA: Diagnosis not present

## 2019-08-13 DIAGNOSIS — R768 Other specified abnormal immunological findings in serum: Secondary | ICD-10-CM | POA: Diagnosis not present

## 2019-08-13 DIAGNOSIS — M79641 Pain in right hand: Secondary | ICD-10-CM | POA: Diagnosis not present

## 2019-08-13 DIAGNOSIS — M2550 Pain in unspecified joint: Secondary | ICD-10-CM | POA: Diagnosis not present

## 2019-08-13 DIAGNOSIS — M79642 Pain in left hand: Secondary | ICD-10-CM | POA: Diagnosis not present

## 2019-08-13 DIAGNOSIS — R5382 Chronic fatigue, unspecified: Secondary | ICD-10-CM | POA: Diagnosis not present

## 2019-08-29 DIAGNOSIS — M810 Age-related osteoporosis without current pathological fracture: Secondary | ICD-10-CM | POA: Diagnosis not present

## 2019-08-29 DIAGNOSIS — R5383 Other fatigue: Secondary | ICD-10-CM | POA: Diagnosis not present

## 2019-09-03 DIAGNOSIS — M810 Age-related osteoporosis without current pathological fracture: Secondary | ICD-10-CM | POA: Diagnosis not present

## 2019-09-15 DIAGNOSIS — E039 Hypothyroidism, unspecified: Secondary | ICD-10-CM | POA: Diagnosis not present

## 2019-10-24 DIAGNOSIS — G20 Parkinson's disease: Secondary | ICD-10-CM | POA: Diagnosis not present

## 2019-10-27 DIAGNOSIS — E039 Hypothyroidism, unspecified: Secondary | ICD-10-CM | POA: Diagnosis not present

## 2019-12-02 ENCOUNTER — Encounter: Payer: Self-pay | Admitting: Certified Nurse Midwife

## 2020-01-12 DIAGNOSIS — N393 Stress incontinence (female) (male): Secondary | ICD-10-CM | POA: Diagnosis not present

## 2020-01-12 DIAGNOSIS — E039 Hypothyroidism, unspecified: Secondary | ICD-10-CM | POA: Diagnosis not present

## 2020-01-12 DIAGNOSIS — M810 Age-related osteoporosis without current pathological fracture: Secondary | ICD-10-CM | POA: Diagnosis not present

## 2020-01-12 DIAGNOSIS — I471 Supraventricular tachycardia: Secondary | ICD-10-CM | POA: Diagnosis not present

## 2020-03-02 DIAGNOSIS — R258 Other abnormal involuntary movements: Secondary | ICD-10-CM | POA: Diagnosis not present

## 2020-03-02 DIAGNOSIS — R2689 Other abnormalities of gait and mobility: Secondary | ICD-10-CM | POA: Diagnosis not present

## 2020-03-04 DIAGNOSIS — M810 Age-related osteoporosis without current pathological fracture: Secondary | ICD-10-CM | POA: Diagnosis not present

## 2020-03-04 DIAGNOSIS — E559 Vitamin D deficiency, unspecified: Secondary | ICD-10-CM | POA: Diagnosis not present

## 2020-03-24 DIAGNOSIS — L219 Seborrheic dermatitis, unspecified: Secondary | ICD-10-CM | POA: Diagnosis not present

## 2020-04-05 DIAGNOSIS — R258 Other abnormal involuntary movements: Secondary | ICD-10-CM | POA: Diagnosis not present

## 2020-04-05 DIAGNOSIS — R259 Unspecified abnormal involuntary movements: Secondary | ICD-10-CM | POA: Diagnosis not present

## 2020-04-16 DIAGNOSIS — E049 Nontoxic goiter, unspecified: Secondary | ICD-10-CM | POA: Diagnosis not present

## 2020-04-19 DIAGNOSIS — E079 Disorder of thyroid, unspecified: Secondary | ICD-10-CM | POA: Diagnosis not present

## 2020-04-19 DIAGNOSIS — E049 Nontoxic goiter, unspecified: Secondary | ICD-10-CM | POA: Diagnosis not present

## 2020-04-21 DIAGNOSIS — R9089 Other abnormal findings on diagnostic imaging of central nervous system: Secondary | ICD-10-CM | POA: Diagnosis not present

## 2020-04-21 DIAGNOSIS — R259 Unspecified abnormal involuntary movements: Secondary | ICD-10-CM | POA: Diagnosis not present

## 2020-04-21 DIAGNOSIS — R9439 Abnormal result of other cardiovascular function study: Secondary | ICD-10-CM | POA: Diagnosis not present

## 2020-04-27 DIAGNOSIS — W5501XA Bitten by cat, initial encounter: Secondary | ICD-10-CM | POA: Diagnosis not present

## 2020-04-27 DIAGNOSIS — S61451A Open bite of right hand, initial encounter: Secondary | ICD-10-CM | POA: Diagnosis not present

## 2020-04-27 DIAGNOSIS — Z23 Encounter for immunization: Secondary | ICD-10-CM | POA: Diagnosis not present

## 2020-06-09 DIAGNOSIS — G20 Parkinson's disease: Secondary | ICD-10-CM | POA: Diagnosis not present

## 2020-07-13 ENCOUNTER — Inpatient Hospital Stay: Payer: PPO | Attending: Oncology

## 2020-07-13 ENCOUNTER — Other Ambulatory Visit: Payer: Self-pay

## 2020-07-13 DIAGNOSIS — D472 Monoclonal gammopathy: Secondary | ICD-10-CM | POA: Diagnosis not present

## 2020-07-13 DIAGNOSIS — G20 Parkinson's disease: Secondary | ICD-10-CM | POA: Diagnosis not present

## 2020-07-13 DIAGNOSIS — Z1231 Encounter for screening mammogram for malignant neoplasm of breast: Secondary | ICD-10-CM | POA: Diagnosis not present

## 2020-07-13 DIAGNOSIS — M810 Age-related osteoporosis without current pathological fracture: Secondary | ICD-10-CM | POA: Insufficient documentation

## 2020-07-13 LAB — CBC WITH DIFFERENTIAL (CANCER CENTER ONLY)
Abs Immature Granulocytes: 0.02 10*3/uL (ref 0.00–0.07)
Basophils Absolute: 0.1 10*3/uL (ref 0.0–0.1)
Basophils Relative: 2 %
Eosinophils Absolute: 0.3 10*3/uL (ref 0.0–0.5)
Eosinophils Relative: 4 %
HCT: 40.9 % (ref 36.0–46.0)
Hemoglobin: 13.3 g/dL (ref 12.0–15.0)
Immature Granulocytes: 0 %
Lymphocytes Relative: 28 %
Lymphs Abs: 1.9 10*3/uL (ref 0.7–4.0)
MCH: 30.1 pg (ref 26.0–34.0)
MCHC: 32.5 g/dL (ref 30.0–36.0)
MCV: 92.5 fL (ref 80.0–100.0)
Monocytes Absolute: 0.5 10*3/uL (ref 0.1–1.0)
Monocytes Relative: 7 %
Neutro Abs: 3.9 10*3/uL (ref 1.7–7.7)
Neutrophils Relative %: 59 %
Platelet Count: 232 10*3/uL (ref 150–400)
RBC: 4.42 MIL/uL (ref 3.87–5.11)
RDW: 12.9 % (ref 11.5–15.5)
WBC Count: 6.6 10*3/uL (ref 4.0–10.5)
nRBC: 0 % (ref 0.0–0.2)

## 2020-07-13 LAB — CMP (CANCER CENTER ONLY)
ALT: 18 U/L (ref 0–44)
AST: 23 U/L (ref 15–41)
Albumin: 4.2 g/dL (ref 3.5–5.0)
Alkaline Phosphatase: 76 U/L (ref 38–126)
Anion gap: 8 (ref 5–15)
BUN: 18 mg/dL (ref 8–23)
CO2: 28 mmol/L (ref 22–32)
Calcium: 9.2 mg/dL (ref 8.9–10.3)
Chloride: 106 mmol/L (ref 98–111)
Creatinine: 0.86 mg/dL (ref 0.44–1.00)
GFR, Estimated: >60 mL/min (ref >60)
Glucose, Bld: 58 mg/dL — ABNORMAL LOW (ref 70–99)
Potassium: 3.7 mmol/L (ref 3.5–5.1)
Sodium: 142 mmol/L (ref 135–145)
Total Bilirubin: 0.7 mg/dL (ref 0.3–1.2)
Total Protein: 7.8 g/dL (ref 6.5–8.1)

## 2020-07-14 LAB — MULTIPLE MYELOMA PANEL, SERUM
Albumin SerPl Elph-Mcnc: 3.9 g/dL (ref 2.9–4.4)
Albumin/Glob SerPl: 1.2 (ref 0.7–1.7)
Alpha 1: 0.3 g/dL (ref 0.0–0.4)
Alpha2 Glob SerPl Elph-Mcnc: 0.7 g/dL (ref 0.4–1.0)
B-Globulin SerPl Elph-Mcnc: 0.8 g/dL (ref 0.7–1.3)
Gamma Glob SerPl Elph-Mcnc: 1.6 g/dL (ref 0.4–1.8)
Globulin, Total: 3.3 g/dL (ref 2.2–3.9)
IgA: 87 mg/dL (ref 87–352)
IgG (Immunoglobin G), Serum: 1571 mg/dL (ref 586–1602)
IgM (Immunoglobulin M), Srm: 97 mg/dL (ref 26–217)
M Protein SerPl Elph-Mcnc: 0.7 g/dL — ABNORMAL HIGH
Total Protein ELP: 7.2 g/dL (ref 6.0–8.5)

## 2020-07-14 LAB — KAPPA/LAMBDA LIGHT CHAINS
Kappa free light chain: 30.9 mg/L — ABNORMAL HIGH (ref 3.3–19.4)
Kappa, lambda light chain ratio: 2.78 — ABNORMAL HIGH (ref 0.26–1.65)
Lambda free light chains: 11.1 mg/L (ref 5.7–26.3)

## 2020-07-15 ENCOUNTER — Telehealth: Payer: Self-pay

## 2020-07-15 DIAGNOSIS — Z13220 Encounter for screening for lipoid disorders: Secondary | ICD-10-CM | POA: Diagnosis not present

## 2020-07-15 DIAGNOSIS — E039 Hypothyroidism, unspecified: Secondary | ICD-10-CM | POA: Diagnosis not present

## 2020-07-15 NOTE — Telephone Encounter (Signed)
Faxed labs to PCP per patient's request

## 2020-07-20 ENCOUNTER — Inpatient Hospital Stay: Payer: PPO | Admitting: Oncology

## 2020-07-20 ENCOUNTER — Other Ambulatory Visit: Payer: Self-pay

## 2020-07-20 VITALS — BP 142/81 | HR 60 | Temp 97.4°F | Resp 18 | Ht 62.25 in | Wt 118.9 lb

## 2020-07-20 DIAGNOSIS — D472 Monoclonal gammopathy: Secondary | ICD-10-CM | POA: Diagnosis not present

## 2020-07-20 NOTE — Progress Notes (Signed)
Hematology and Oncology Follow Up Visit  Brianna Greene [id number] [birth year] 64 y.o. 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 She is currently on active surveillance without any evidence of symptomatic multiple myeloma.  Current therapy: Active surveillance.   Interim History:  Brianna Greene is here for repeat follow-up. Since the last visit, she reports no major changes in her health.  She was diagnosed with Parkinson's disease and currently on Sinemet.  She denies any bone pain or pathological fractures.  He denies any excessive weakness or syncope.  Performance status quality of life remains unchanged.  She did report some stiffness which is improved currently.   Medications: Unchanged on review  Current Outpatient Medications:  .  Calcium 250 MG CAPS, Take 3 tablets by mouth daily., Disp: , Rfl:  .  Cholecalciferol (VITAMIN D) 2000 units CAPS, Take 1 capsule by mouth daily., Disp: , Rfl:  .  levothyroxine (SYNTHROID) 88 MCG tablet, Take 88 mcg by mouth daily before breakfast., Disp: , Rfl:  .  Magnesium 500 MG CAPS, Take 1 capsule by mouth daily., Disp: , Rfl:   Allergies: No Known Allergies    Physical Exam:  Blood pressure (!) 142/81, pulse 60, temperature (!) 97.4 F (36.3 C), temperature source Tympanic, resp. rate 18, height 5' 2.25" (1.581 m), weight 118 lb 14.4 oz (53.9 kg), last menstrual period 09/11/2005, SpO2 100 %.    ECOG: 0   General appearance: Alert, awake without any distress. Head: Atraumatic without abnormalities Oropharynx: Without any thrush or ulcers. Eyes: No scleral icterus. Lymph nodes: No lymphadenopathy noted in the cervical, supraclavicular, or axillary nodes Heart:regular rate and rhythm, without any murmurs or gallops.   Lung: Clear to auscultation without any rhonchi, wheezes or dullness to percussion. Abdomin: Soft, nontender without any shifting  dullness or ascites. Musculoskeletal: No clubbing or cyanosis. Neurological: No motor or sensory deficits. Skin: No rashes or lesions.     Lab Results: Lab Results  Component Value Date   WBC 6.6 07/13/2020   HGB 13.3 07/13/2020   HCT 40.9 07/13/2020   MCV 92.5 07/13/2020   PLT 232 07/13/2020     Chemistry      Component Value Date/Time   NA 142 07/13/2020 1056   K 3.7 07/13/2020 1056   CL 106 07/13/2020 1056   CO2 28 07/13/2020 1056   BUN 18 07/13/2020 1056   CREATININE 0.86 07/13/2020 1056      Component Value Date/Time   CALCIUM 9.2 07/13/2020 1056   ALKPHOS 76 07/13/2020 1056   AST 23 07/13/2020 1056   ALT 18 07/13/2020 1056   BILITOT 0.7 07/13/2020 653      64 year old woman with:  1. MGUS diagnosed in 2011. She was found to have IgG kappa with M spike of 0.7 g/dL and elevated IgG level at 1960.    She is currently on active surveillance without any indication for treatment or signs or symptoms of multiple myeloma progression. Protein studies obtained on July 13, 2020 were reviewed and showed an M spike of 0.7 g/dL which is unchanged for the last 10 years. IgG level was within normal range at 1571. CBC and chemistries showed all normal range without any signs to suggest endorgan damage.  The natural course of this disease was reviewed and risk of progression into symptomatic multiple myeloma were reiterated. The risk remains low and I recommended continued active surveillance at this  time.  The differential diagnosis including reactive findings as well as MGUS.  Active treatment will be deferred unless she evolves into multiple myeloma.  He is low risk disease at this time and does not warrant treatment.  2.  Osteoporosis: No fractures noticed as the last visit.  This is unrelated to a plasma cell disorder.  3.  Follow-up: In 1 year for repeat follow-up.   30  minutes were dedicated to this visit today.  The time was spent on reviewing laboratory data,  discussing frontal diagnosis and management options for the future.Brianna Button, MD 11/9/20211:24 PM

## 2020-08-07 DIAGNOSIS — N390 Urinary tract infection, site not specified: Secondary | ICD-10-CM | POA: Diagnosis not present

## 2020-08-07 DIAGNOSIS — R350 Frequency of micturition: Secondary | ICD-10-CM | POA: Diagnosis not present

## 2020-08-07 DIAGNOSIS — R300 Dysuria: Secondary | ICD-10-CM | POA: Diagnosis not present

## 2020-08-20 IMAGING — US ULTRASOUND ABDOMEN LIMITED
1 series · 14 of 25 positions shown · non-contrast
Comparison: 05/07/2015

CLINICAL DATA: Gallbladder polyp

EXAM:
ULTRASOUND ABDOMEN LIMITED RIGHT UPPER QUADRANT

[Series 1: ultrasound abdomen limited · 0.11mm/px · 14 of 69 slices shown]
[im 1/69]
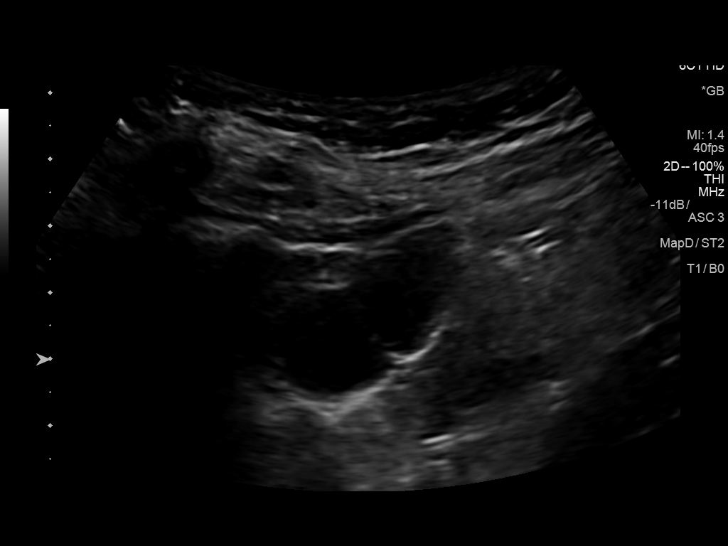
[im 6/69]
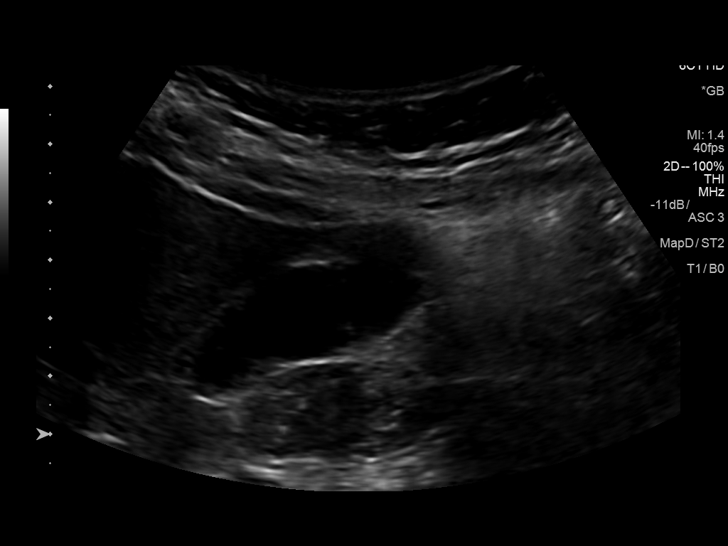
[im 12/69]
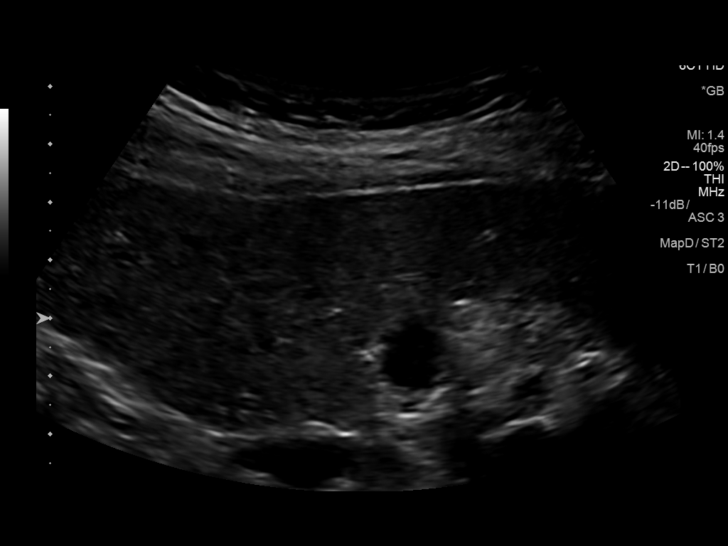
[im 18/69]
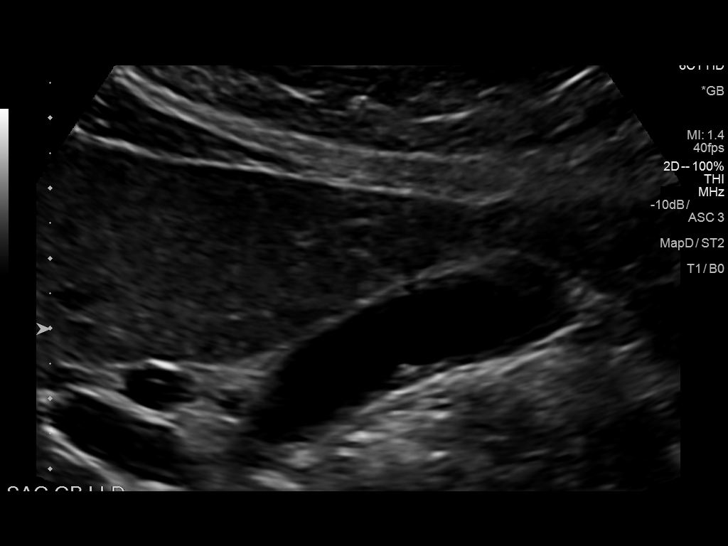
[im 23/69]
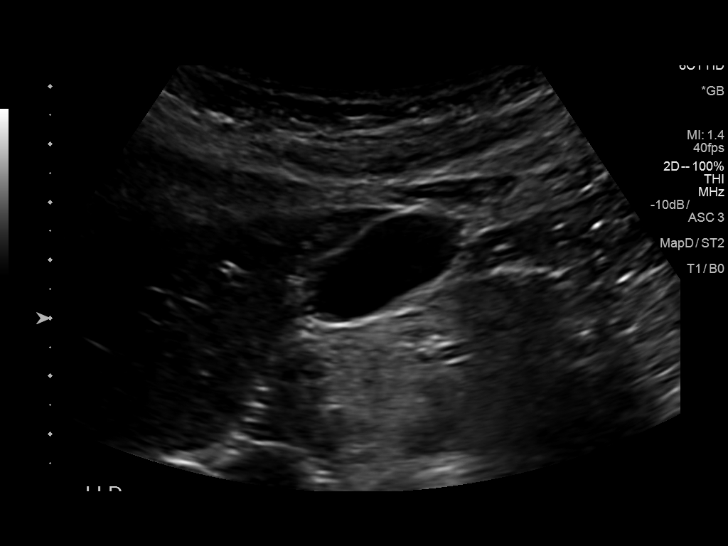
[im 26/69]
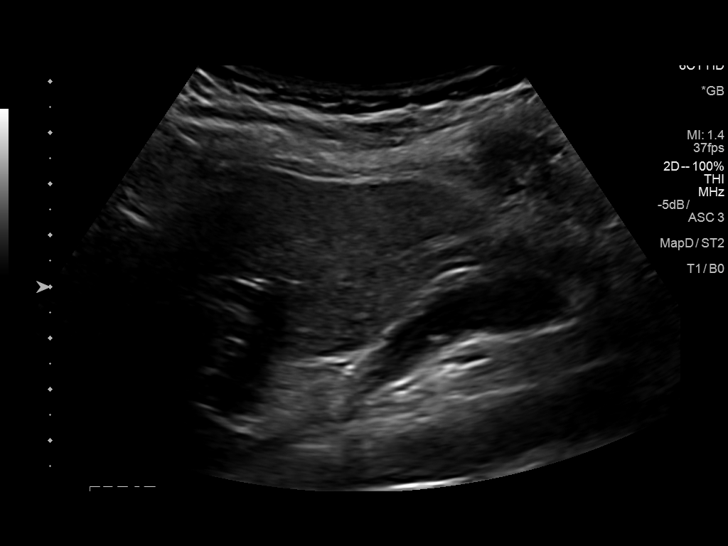
[im 32/69]
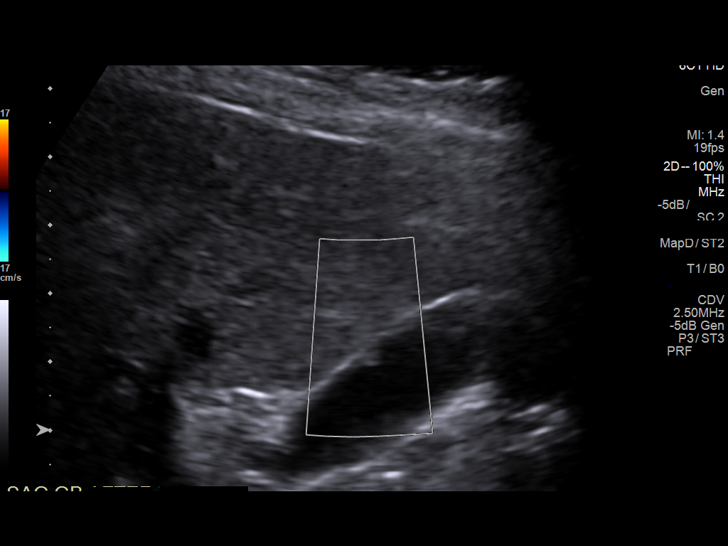
[im 37/69]
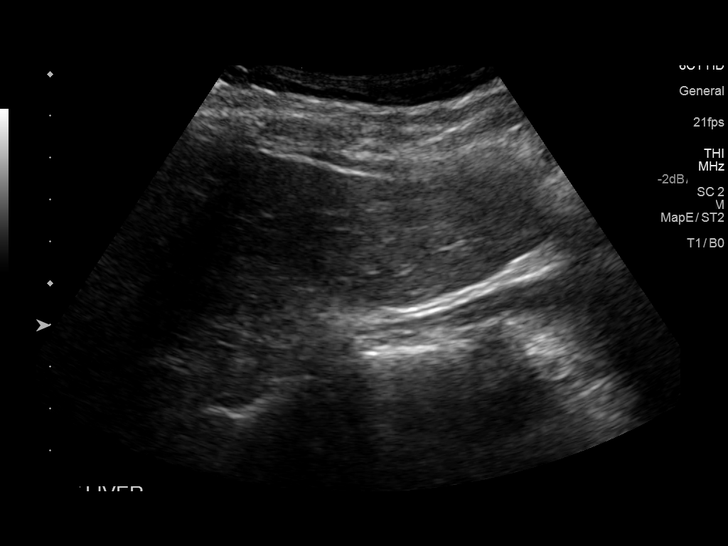
[im 43/69]
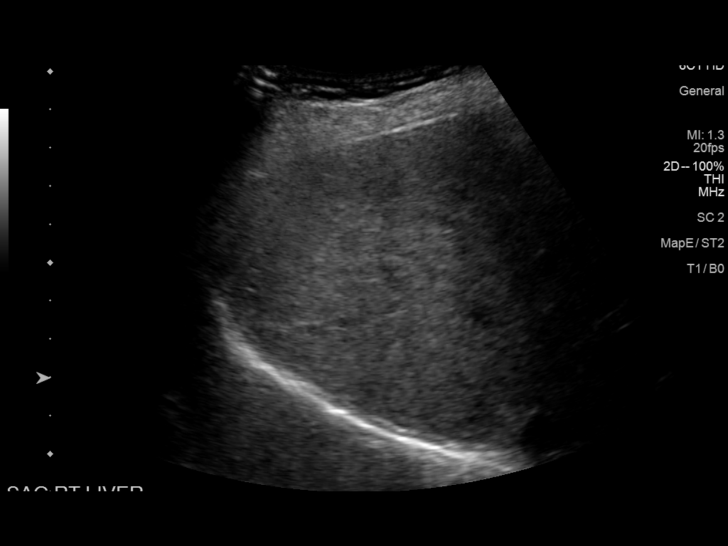
[im 46/69]
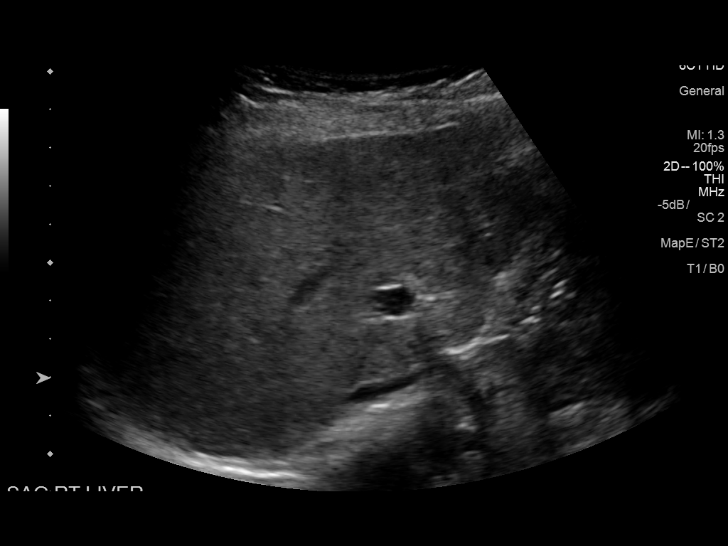
[im 52/69]
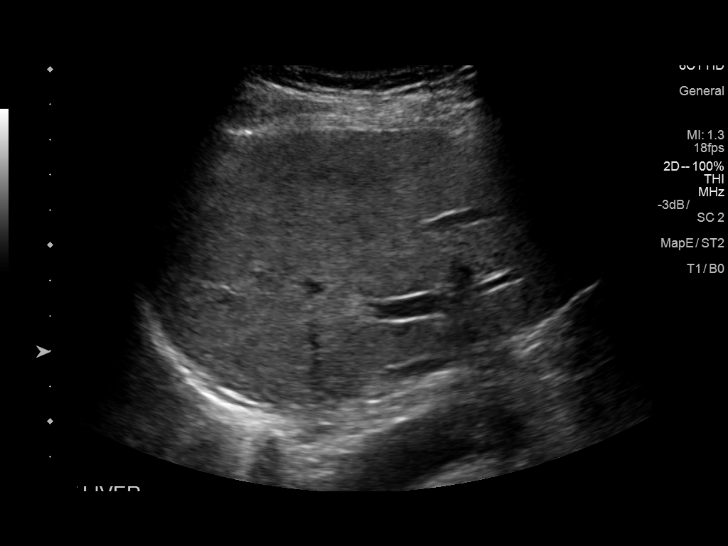
[im 57/69]
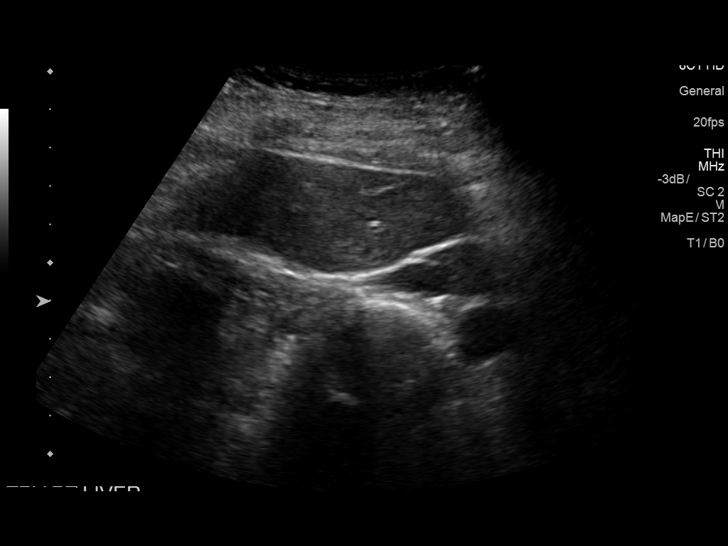
[im 63/69]
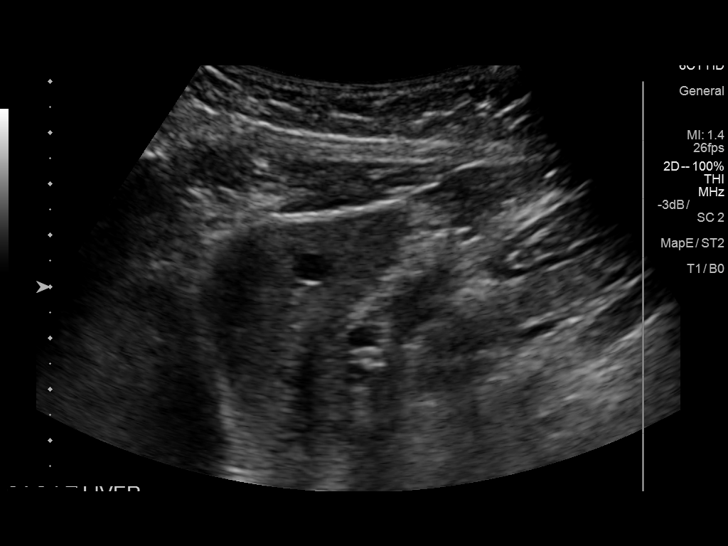
[im 69/69]
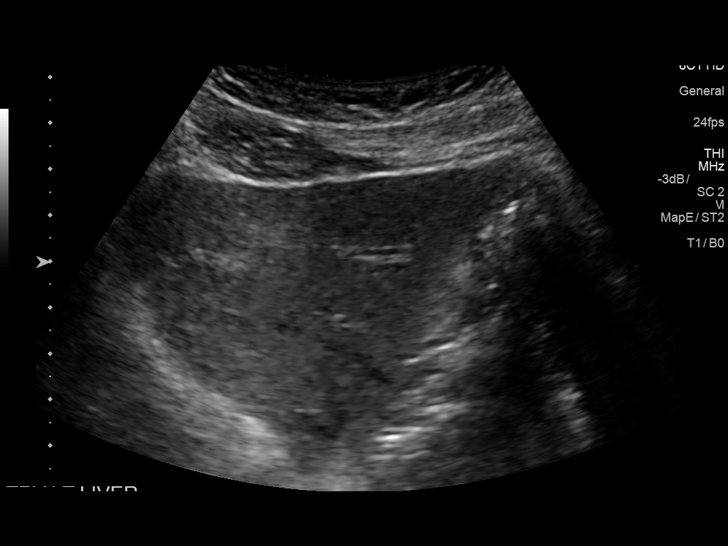

[14 of 25 positions shown; findings below may reference images not displayed]

FINDINGS: Gallbladder:

Normally distended without stones or wall thickening. Three small
polyps are identified, largest 4 mm. No pericholecystic fluid or
sonographic Murphy sign.

Common bile duct:

Diameter: 6 mm diameter, normal for age

Liver:

Normal appendix echogenicity. Smooth contours without nodularity.
Small cyst in LEFT lobe 7 x 6 x 6 mm, not seen previously,
containing a few scattered internal echoes and good through
transmission. Portal vein is patent on color Doppler imaging with
normal direction of blood flow towards the liver.

No RIGHT upper quadrant free fluid.
IMPRESSION: Tiny gallbladder polyps unchanged; no follow-up required.

This recommendation follows ACR consensus guidelines: White Paper of
the ACR Incidental Findings Committee II on Gallbladder and Biliary
Findings. [HOSPITAL] 8483:;[DATE].

Probable small cyst LEFT lobe liver 7 mm diameter.

## 2020-09-22 DIAGNOSIS — M810 Age-related osteoporosis without current pathological fracture: Secondary | ICD-10-CM | POA: Diagnosis not present

## 2020-09-22 DIAGNOSIS — Z23 Encounter for immunization: Secondary | ICD-10-CM | POA: Diagnosis not present

## 2020-09-22 DIAGNOSIS — E039 Hypothyroidism, unspecified: Secondary | ICD-10-CM | POA: Diagnosis not present

## 2020-10-07 DIAGNOSIS — M810 Age-related osteoporosis without current pathological fracture: Secondary | ICD-10-CM | POA: Diagnosis not present

## 2020-12-30 DIAGNOSIS — L821 Other seborrheic keratosis: Secondary | ICD-10-CM | POA: Diagnosis not present

## 2020-12-30 DIAGNOSIS — D2239 Melanocytic nevi of other parts of face: Secondary | ICD-10-CM | POA: Diagnosis not present

## 2020-12-30 DIAGNOSIS — G20 Parkinson's disease: Secondary | ICD-10-CM | POA: Diagnosis not present

## 2020-12-30 DIAGNOSIS — D1801 Hemangioma of skin and subcutaneous tissue: Secondary | ICD-10-CM | POA: Diagnosis not present

## 2020-12-30 DIAGNOSIS — D225 Melanocytic nevi of trunk: Secondary | ICD-10-CM | POA: Diagnosis not present

## 2020-12-30 DIAGNOSIS — L820 Inflamed seborrheic keratosis: Secondary | ICD-10-CM | POA: Diagnosis not present

## 2021-01-12 DIAGNOSIS — E039 Hypothyroidism, unspecified: Secondary | ICD-10-CM | POA: Diagnosis not present

## 2021-01-12 DIAGNOSIS — I471 Supraventricular tachycardia: Secondary | ICD-10-CM | POA: Diagnosis not present

## 2021-01-12 DIAGNOSIS — N3941 Urge incontinence: Secondary | ICD-10-CM | POA: Diagnosis not present

## 2021-03-10 DIAGNOSIS — R5383 Other fatigue: Secondary | ICD-10-CM | POA: Diagnosis not present

## 2021-03-10 DIAGNOSIS — M810 Age-related osteoporosis without current pathological fracture: Secondary | ICD-10-CM | POA: Diagnosis not present

## 2021-03-10 DIAGNOSIS — E559 Vitamin D deficiency, unspecified: Secondary | ICD-10-CM | POA: Diagnosis not present

## 2021-04-12 DIAGNOSIS — M810 Age-related osteoporosis without current pathological fracture: Secondary | ICD-10-CM | POA: Diagnosis not present

## 2021-04-12 DIAGNOSIS — M25551 Pain in right hip: Secondary | ICD-10-CM | POA: Diagnosis not present

## 2021-04-12 DIAGNOSIS — M25552 Pain in left hip: Secondary | ICD-10-CM | POA: Diagnosis not present

## 2021-05-02 DIAGNOSIS — G20 Parkinson's disease: Secondary | ICD-10-CM | POA: Diagnosis not present

## 2021-07-01 DIAGNOSIS — R259 Unspecified abnormal involuntary movements: Secondary | ICD-10-CM | POA: Diagnosis not present

## 2021-07-13 ENCOUNTER — Inpatient Hospital Stay: Payer: PPO | Attending: Oncology

## 2021-07-13 ENCOUNTER — Other Ambulatory Visit: Payer: Self-pay

## 2021-07-13 DIAGNOSIS — D472 Monoclonal gammopathy: Secondary | ICD-10-CM | POA: Diagnosis not present

## 2021-07-13 DIAGNOSIS — M810 Age-related osteoporosis without current pathological fracture: Secondary | ICD-10-CM | POA: Diagnosis not present

## 2021-07-13 DIAGNOSIS — G20 Parkinson's disease: Secondary | ICD-10-CM | POA: Diagnosis not present

## 2021-07-13 DIAGNOSIS — Z79899 Other long term (current) drug therapy: Secondary | ICD-10-CM | POA: Insufficient documentation

## 2021-07-13 LAB — CMP (CANCER CENTER ONLY)
ALT: 8 U/L (ref 0–44)
AST: 22 U/L (ref 15–41)
Albumin: 4.4 g/dL (ref 3.5–5.0)
Alkaline Phosphatase: 88 U/L (ref 38–126)
Anion gap: 8 (ref 5–15)
BUN: 19 mg/dL (ref 8–23)
CO2: 27 mmol/L (ref 22–32)
Calcium: 8.9 mg/dL (ref 8.9–10.3)
Chloride: 104 mmol/L (ref 98–111)
Creatinine: 0.91 mg/dL (ref 0.44–1.00)
GFR, Estimated: >60 mL/min (ref >60)
Glucose, Bld: 64 mg/dL — ABNORMAL LOW (ref 70–99)
Potassium: 3.6 mmol/L (ref 3.5–5.1)
Sodium: 139 mmol/L (ref 135–145)
Total Bilirubin: 0.9 mg/dL (ref 0.3–1.2)
Total Protein: 8.1 g/dL (ref 6.5–8.1)

## 2021-07-13 LAB — CBC WITH DIFFERENTIAL (CANCER CENTER ONLY)
Abs Immature Granulocytes: 0.01 10*3/uL (ref 0.00–0.07)
Basophils Absolute: 0.1 10*3/uL (ref 0.0–0.1)
Basophils Relative: 1 %
Eosinophils Absolute: 0.3 10*3/uL (ref 0.0–0.5)
Eosinophils Relative: 5 %
HCT: 40.8 % (ref 36.0–46.0)
Hemoglobin: 13.7 g/dL (ref 12.0–15.0)
Immature Granulocytes: 0 %
Lymphocytes Relative: 32 %
Lymphs Abs: 2.1 10*3/uL (ref 0.7–4.0)
MCH: 30.4 pg (ref 26.0–34.0)
MCHC: 33.6 g/dL (ref 30.0–36.0)
MCV: 90.7 fL (ref 80.0–100.0)
Monocytes Absolute: 0.5 10*3/uL (ref 0.1–1.0)
Monocytes Relative: 7 %
Neutro Abs: 3.7 10*3/uL (ref 1.7–7.7)
Neutrophils Relative %: 55 %
Platelet Count: 227 10*3/uL (ref 150–400)
RBC: 4.5 MIL/uL (ref 3.87–5.11)
RDW: 13.1 % (ref 11.5–15.5)
WBC Count: 6.7 10*3/uL (ref 4.0–10.5)
nRBC: 0 % (ref 0.0–0.2)

## 2021-07-14 LAB — KAPPA/LAMBDA LIGHT CHAINS
Kappa free light chain: 34.5 mg/L — ABNORMAL HIGH (ref 3.3–19.4)
Kappa, lambda light chain ratio: 3.05 — ABNORMAL HIGH (ref 0.26–1.65)
Lambda free light chains: 11.3 mg/L (ref 5.7–26.3)

## 2021-07-18 LAB — MULTIPLE MYELOMA PANEL, SERUM
Albumin SerPl Elph-Mcnc: 4 g/dL (ref 2.9–4.4)
Albumin/Glob SerPl: 1.3 (ref 0.7–1.7)
Alpha 1: 0.3 g/dL (ref 0.0–0.4)
Alpha2 Glob SerPl Elph-Mcnc: 0.6 g/dL (ref 0.4–1.0)
B-Globulin SerPl Elph-Mcnc: 0.8 g/dL (ref 0.7–1.3)
Gamma Glob SerPl Elph-Mcnc: 1.6 g/dL (ref 0.4–1.8)
Globulin, Total: 3.3 g/dL (ref 2.2–3.9)
IgA: 78 mg/dL — ABNORMAL LOW (ref 87–352)
IgG (Immunoglobin G), Serum: 1525 mg/dL (ref 586–1602)
IgM (Immunoglobulin M), Srm: 99 mg/dL (ref 26–217)
M Protein SerPl Elph-Mcnc: 0.5 g/dL — ABNORMAL HIGH
Total Protein ELP: 7.3 g/dL (ref 6.0–8.5)

## 2021-07-19 DIAGNOSIS — M810 Age-related osteoporosis without current pathological fracture: Secondary | ICD-10-CM | POA: Diagnosis not present

## 2021-07-19 DIAGNOSIS — Z1231 Encounter for screening mammogram for malignant neoplasm of breast: Secondary | ICD-10-CM | POA: Diagnosis not present

## 2021-07-20 ENCOUNTER — Other Ambulatory Visit: Payer: Self-pay

## 2021-07-20 ENCOUNTER — Inpatient Hospital Stay: Payer: PPO | Admitting: Oncology

## 2021-07-20 VITALS — BP 120/74 | HR 58 | Temp 98.1°F | Resp 18 | Wt 122.6 lb

## 2021-07-20 DIAGNOSIS — D472 Monoclonal gammopathy: Secondary | ICD-10-CM | POA: Diagnosis not present

## 2021-07-20 NOTE — Progress Notes (Signed)
Hematology and Oncology Follow Up Visit  PAGE PUCCIARELLI [id number] 1956/08/03 66 y.o. 07/20/2021 10:21 AM   Principle Diagnosis: 65 year old woman with MGUS diagnosed in 2011.  She was found to have IgG kappa with a 0.7 g/dL M spike and no evidence of endorgan damage.  Current therapy: Active surveillance.   Interim History:  Ms. Jasso presents today for repeat evaluation.  Since the last visit, she reports no major changes in her health.  She was diagnosed with Parkinson's disease and currently on Sinemet.  She denies recent hospitalization or illnesses she denies any bone pain or pathological fractures.  She denies any constitutional symptoms or weight loss.   Medications: Unchanged on review  Current Outpatient Medications:    Calcium 250 MG CAPS, Take 3 tablets by mouth daily., Disp: , Rfl:    Cholecalciferol (VITAMIN D) 2000 units CAPS, Take 1 capsule by mouth daily., Disp: , Rfl:    levothyroxine (SYNTHROID) 88 MCG tablet, Take 88 mcg by mouth daily before breakfast., Disp: , Rfl:    Magnesium 500 MG CAPS, Take 1 capsule by mouth daily., Disp: , Rfl:   Allergies: No Known Allergies    Physical Exam:  Blood pressure 120/74, pulse (!) 58, temperature 98.1 F (36.7 C), resp. rate 18, weight 122 lb 9.6 oz (55.6 kg), last menstrual period 09/11/2005, SpO2 100 %.    ECOG: 0    General appearance: Comfortable appearing without any discomfort Head: Normocephalic without any trauma Oropharynx: Mucous membranes are moist and pink without any thrush or ulcers. Eyes: Pupils are equal and round reactive to light. Lymph nodes: No cervical, supraclavicular, inguinal or axillary lymphadenopathy.   Heart:regular rate and rhythm.  S1 and S2 without leg edema. Lung: Clear without any rhonchi or wheezes.  No dullness to percussion. Abdomin: Soft, nontender, nondistended with good bowel sounds.  No hepatosplenomegaly. Musculoskeletal: No joint deformity or effusion.  Full range of motion  noted. Neurological: No deficits noted on motor, sensory and deep tendon reflex exam. Skin: No petechial rash or dryness.  Appeared moist.       Lab Results: Lab Results  Component Value Date   WBC 6.7 07/13/2021   HGB 13.7 07/13/2021   HCT 40.8 07/13/2021   MCV 90.7 07/13/2021   PLT 227 07/13/2021     Chemistry      Component Value Date/Time   NA 139 07/13/2021 1001   K 3.6 07/13/2021 1001   CL 104 07/13/2021 1001   CO2 27 07/13/2021 1001   BUN 19 07/13/2021 1001   CREATININE 0.91 07/13/2021 1001      Component Value Date/Time   CALCIUM 8.9 07/13/2021 1001   ALKPHOS 88 07/13/2021 1001   AST 22 07/13/2021 1001   ALT 8 07/13/2021 1001   BILITOT 0.9 07/13/2021 4984      65 year old woman with:  1.  IgG kappa MGUS diagnosed in 2011.   Laboratory data obtained on 07/13/2021 were personally reviewed and showed an M spike of 0.5 g/dL which is consistent with her baseline.  She has no evidence of endorgan damage including normal kidney function, electrolytes and normal CBC.  The natural course of this disease was reviewed at this time and treatment choices were discussed.  The risk of progression to multiple myeloma was assessed and remains fairly low around 1 %/year.  At this time, I recommended continued annual monitoring.  Treatment for multiple myeloma in the future were reviewed but only if she develops progression of disease and endorgan damage to  fit the diagnosis.  2.  Osteoporosis: This is unrelated to plasma cell disorder.  3.  Parkinson's disease: She is following with neurology currently on Sinemet.  3.  Follow-up: In 12 months for repeat evaluation.   30  minutes were spent on this encounter.  Time was dedicated to reviewing laboratory data, disease status update and future plan of care discussion.    Zola Button, MD 11/9/202210:21 AM

## 2021-07-29 DIAGNOSIS — Z23 Encounter for immunization: Secondary | ICD-10-CM | POA: Diagnosis not present

## 2021-07-29 DIAGNOSIS — N3941 Urge incontinence: Secondary | ICD-10-CM | POA: Diagnosis not present

## 2021-07-29 DIAGNOSIS — R5383 Other fatigue: Secondary | ICD-10-CM | POA: Diagnosis not present

## 2021-07-29 DIAGNOSIS — R5381 Other malaise: Secondary | ICD-10-CM | POA: Diagnosis not present

## 2021-07-29 DIAGNOSIS — Z79899 Other long term (current) drug therapy: Secondary | ICD-10-CM | POA: Diagnosis not present

## 2021-07-29 DIAGNOSIS — Z0000 Encounter for general adult medical examination without abnormal findings: Secondary | ICD-10-CM | POA: Diagnosis not present

## 2021-07-29 DIAGNOSIS — E039 Hypothyroidism, unspecified: Secondary | ICD-10-CM | POA: Diagnosis not present

## 2021-07-29 DIAGNOSIS — I471 Supraventricular tachycardia: Secondary | ICD-10-CM | POA: Diagnosis not present

## 2021-08-12 DIAGNOSIS — E039 Hypothyroidism, unspecified: Secondary | ICD-10-CM | POA: Diagnosis not present

## 2021-08-12 DIAGNOSIS — I471 Supraventricular tachycardia: Secondary | ICD-10-CM | POA: Diagnosis not present

## 2021-08-12 DIAGNOSIS — Z23 Encounter for immunization: Secondary | ICD-10-CM | POA: Diagnosis not present

## 2021-08-12 DIAGNOSIS — N3941 Urge incontinence: Secondary | ICD-10-CM | POA: Diagnosis not present

## 2021-08-12 DIAGNOSIS — Z13220 Encounter for screening for lipoid disorders: Secondary | ICD-10-CM | POA: Diagnosis not present

## 2022-06-27 ENCOUNTER — Telehealth: Payer: Self-pay | Admitting: Oncology

## 2022-06-27 NOTE — Telephone Encounter (Signed)
Called patient regarding November appointment, patient is notified.

## 2022-07-19 ENCOUNTER — Other Ambulatory Visit: Payer: Self-pay

## 2022-07-19 ENCOUNTER — Inpatient Hospital Stay: Payer: PPO | Attending: Oncology

## 2022-07-19 DIAGNOSIS — D472 Monoclonal gammopathy: Secondary | ICD-10-CM | POA: Diagnosis present

## 2022-07-19 DIAGNOSIS — G20A1 Parkinson's disease without dyskinesia, without mention of fluctuations: Secondary | ICD-10-CM | POA: Diagnosis not present

## 2022-07-19 DIAGNOSIS — Z79899 Other long term (current) drug therapy: Secondary | ICD-10-CM | POA: Diagnosis not present

## 2022-07-19 DIAGNOSIS — M810 Age-related osteoporosis without current pathological fracture: Secondary | ICD-10-CM | POA: Diagnosis not present

## 2022-07-19 LAB — CBC WITH DIFFERENTIAL (CANCER CENTER ONLY)
Abs Immature Granulocytes: 0.02 10*3/uL (ref 0.00–0.07)
Basophils Absolute: 0.1 10*3/uL (ref 0.0–0.1)
Basophils Relative: 1 %
Eosinophils Absolute: 0.3 10*3/uL (ref 0.0–0.5)
Eosinophils Relative: 4 %
HCT: 38.6 % (ref 36.0–46.0)
Hemoglobin: 13.3 g/dL (ref 12.0–15.0)
Immature Granulocytes: 0 %
Lymphocytes Relative: 31 %
Lymphs Abs: 1.9 10*3/uL (ref 0.7–4.0)
MCH: 31.1 pg (ref 26.0–34.0)
MCHC: 34.5 g/dL (ref 30.0–36.0)
MCV: 90.2 fL (ref 80.0–100.0)
Monocytes Absolute: 0.5 10*3/uL (ref 0.1–1.0)
Monocytes Relative: 8 %
Neutro Abs: 3.5 10*3/uL (ref 1.7–7.7)
Neutrophils Relative %: 56 %
Platelet Count: 242 10*3/uL (ref 150–400)
RBC: 4.28 MIL/uL (ref 3.87–5.11)
RDW: 13.1 % (ref 11.5–15.5)
WBC Count: 6.3 10*3/uL (ref 4.0–10.5)
nRBC: 0 % (ref 0.0–0.2)

## 2022-07-19 LAB — CMP (CANCER CENTER ONLY)
ALT: <5 U/L (ref 0–44)
AST: 18 U/L (ref 15–41)
Albumin: 4.3 g/dL (ref 3.5–5.0)
Alkaline Phosphatase: 73 U/L (ref 38–126)
Anion gap: 4 — ABNORMAL LOW (ref 5–15)
BUN: 15 mg/dL (ref 8–23)
CO2: 29 mmol/L (ref 22–32)
Calcium: 9.3 mg/dL (ref 8.9–10.3)
Chloride: 104 mmol/L (ref 98–111)
Creatinine: 0.89 mg/dL (ref 0.44–1.00)
GFR, Estimated: >60 mL/min (ref >60)
Glucose, Bld: 78 mg/dL (ref 70–99)
Potassium: 3.9 mmol/L (ref 3.5–5.1)
Sodium: 137 mmol/L (ref 135–145)
Total Bilirubin: 0.9 mg/dL (ref 0.3–1.2)
Total Protein: 7.4 g/dL (ref 6.5–8.1)

## 2022-07-20 LAB — KAPPA/LAMBDA LIGHT CHAINS
Kappa free light chain: 36.7 mg/L — ABNORMAL HIGH (ref 3.3–19.4)
Kappa, lambda light chain ratio: 3.71 — ABNORMAL HIGH (ref 0.26–1.65)
Lambda free light chains: 9.9 mg/L (ref 5.7–26.3)

## 2022-07-24 LAB — MULTIPLE MYELOMA PANEL, SERUM
Albumin SerPl Elph-Mcnc: 4 g/dL (ref 2.9–4.4)
Albumin/Glob SerPl: 1.5 (ref 0.7–1.7)
Alpha 1: 0.2 g/dL (ref 0.0–0.4)
Alpha2 Glob SerPl Elph-Mcnc: 0.6 g/dL (ref 0.4–1.0)
B-Globulin SerPl Elph-Mcnc: 0.7 g/dL (ref 0.7–1.3)
Gamma Glob SerPl Elph-Mcnc: 1.4 g/dL (ref 0.4–1.8)
Globulin, Total: 2.8 g/dL (ref 2.2–3.9)
IgA: 67 mg/dL — ABNORMAL LOW (ref 87–352)
IgG (Immunoglobin G), Serum: 1462 mg/dL (ref 586–1602)
IgM (Immunoglobulin M), Srm: 81 mg/dL (ref 26–217)
M Protein SerPl Elph-Mcnc: 0.6 g/dL — ABNORMAL HIGH
Total Protein ELP: 6.8 g/dL (ref 6.0–8.5)

## 2022-07-26 ENCOUNTER — Inpatient Hospital Stay (HOSPITAL_BASED_OUTPATIENT_CLINIC_OR_DEPARTMENT_OTHER): Payer: PPO | Admitting: Oncology

## 2022-07-26 VITALS — BP 120/64 | HR 60 | Temp 97.8°F | Resp 16 | Ht 62.25 in | Wt 124.5 lb

## 2022-07-26 DIAGNOSIS — D472 Monoclonal gammopathy: Secondary | ICD-10-CM

## 2022-07-26 NOTE — Progress Notes (Signed)
Hematology and Oncology Follow Up Visit  Brianna Greene [id number] Feb 06, 1956 66 y.o. 07/26/2022 9:55 AM   Principle Diagnosis: 66 year old woman with IgG kappa MGUS diagnosed in 2011.  She presented with a 0.7 g/dL M spike and no evidence of symptomatic multiple myeloma.  Current therapy: Annual surveillance.  Interim History:  Brianna Greene returns today for a follow-up visit.  Since last visit, she reports no major changes in her health.  She denies any recent hospitalizations or illnesses.  She denies any bone pain or pathological fractures.  Her Parkinson's is under excellent control at this time.  He denies any falls or syncope.  She denies any weakness or fatigue or any other constitutional symptoms.  Medications: Updated on review. Current Outpatient Medications:    Calcium 250 MG CAPS, Take 3 tablets by mouth daily., Disp: , Rfl:    Cholecalciferol (VITAMIN D) 2000 units CAPS, Take 1 capsule by mouth daily., Disp: , Rfl:    levothyroxine (SYNTHROID) 88 MCG tablet, Take 88 mcg by mouth daily before breakfast., Disp: , Rfl:    Magnesium 500 MG CAPS, Take 1 capsule by mouth daily., Disp: , Rfl:   Allergies: No Known Allergies    Physical Exam:   Blood pressure 120/64, pulse 60, temperature 97.8 F (36.6 C), temperature source Temporal, resp. rate 16, height 5' 2.25" (1.581 m), weight 124 lb 8 oz (56.5 kg), last menstrual period 09/11/2005, SpO2 100 %.    ECOG: 0     General appearance: Alert, awake without any distress. Head: Atraumatic without abnormalities Oropharynx: Without any thrush or ulcers. Eyes: No scleral icterus. Lymph nodes: No lymphadenopathy noted in the cervical, supraclavicular, or axillary nodes Heart:regular rate and rhythm, without any murmurs or gallops.   Lung: Clear to auscultation without any rhonchi, wheezes or dullness to percussion. Abdomin: Soft, nontender without any shifting dullness or ascites. Musculoskeletal: No clubbing or  cyanosis. Neurological: No motor or sensory deficits. Skin: No rashes or lesions.       Lab Results: Lab Results  Component Value Date   WBC 6.3 07/19/2022   HGB 13.3 07/19/2022   HCT 38.6 07/19/2022   MCV 90.2 07/19/2022   PLT 242 07/19/2022     Chemistry      Component Value Date/Time   NA 137 07/19/2022 0908   K 3.9 07/19/2022 0908   CL 104 07/19/2022 0908   CO2 29 07/19/2022 0908   BUN 15 07/19/2022 0908   CREATININE 0.89 07/19/2022 0908      Component Value Date/Time   CALCIUM 9.3 07/19/2022 0908   ALKPHOS 73 07/19/2022 0908   AST 18 07/19/2022 0908   ALT <5 07/19/2022 0908   BILITOT 0.9 07/19/2022 2568      66 year old woman with:  1.  Monoclonal gammopathy diagnosed in 2011.  She was found to have IgG kappa MGUS without evidence of endorgan damage or indication for treatment.  Laboratory data obtained on July 19, 2022 were personally reviewed and continues to show very minimal M spike of 0.6 g/dL and normal IgG level.  He has no evidence of endorgan damage with normal CBC and chemistries.  She has slight elevation in her kappa to lambda ratio which is not dramatically changed.   The risk of progression into symptomatic multiple myeloma was discussed and treatment choices were reviewed.  At this time, I recommended annual surveillance given the chronicity of these findings and stability of her protein studies.  2.  Osteoporosis: She remains on calcium and vitamin D  supplements.   3.  Follow-up: She will return in 1 year for a follow-up.   30  minutes were dedicated to this visit.  The time was spent on reviewing laboratory data, disease status update and updating future plan of care review.   Zola Button, MD 11/15/20239:55 AM

## 2023-01-18 ENCOUNTER — Telehealth: Payer: Self-pay | Admitting: Hematology and Oncology

## 2023-07-10 ENCOUNTER — Other Ambulatory Visit: Payer: PPO

## 2023-07-13 ENCOUNTER — Ambulatory Visit: Payer: PPO | Admitting: Physician Assistant

## 2023-07-24 ENCOUNTER — Other Ambulatory Visit: Payer: PPO

## 2023-07-27 ENCOUNTER — Other Ambulatory Visit: Payer: Self-pay | Admitting: Hematology and Oncology

## 2023-07-27 ENCOUNTER — Inpatient Hospital Stay: Payer: PPO | Attending: Hematology and Oncology

## 2023-07-27 DIAGNOSIS — M810 Age-related osteoporosis without current pathological fracture: Secondary | ICD-10-CM | POA: Diagnosis not present

## 2023-07-27 DIAGNOSIS — G20A1 Parkinson's disease without dyskinesia, without mention of fluctuations: Secondary | ICD-10-CM | POA: Diagnosis not present

## 2023-07-27 DIAGNOSIS — Z79899 Other long term (current) drug therapy: Secondary | ICD-10-CM | POA: Diagnosis not present

## 2023-07-27 DIAGNOSIS — D472 Monoclonal gammopathy: Secondary | ICD-10-CM | POA: Diagnosis present

## 2023-07-27 LAB — CMP (CANCER CENTER ONLY)
ALT: 5 U/L (ref 0–44)
AST: 28 U/L (ref 15–41)
Albumin: 4.4 g/dL (ref 3.5–5.0)
Alkaline Phosphatase: 84 U/L (ref 38–126)
Anion gap: 3 — ABNORMAL LOW (ref 5–15)
BUN: 16 mg/dL (ref 8–23)
CO2: 29 mmol/L (ref 22–32)
Calcium: 9.4 mg/dL (ref 8.9–10.3)
Chloride: 106 mmol/L (ref 98–111)
Creatinine: 0.83 mg/dL (ref 0.44–1.00)
GFR, Estimated: >60 mL/min (ref >60)
Glucose, Bld: 84 mg/dL (ref 70–99)
Potassium: 4.2 mmol/L (ref 3.5–5.1)
Sodium: 138 mmol/L (ref 135–145)
Total Bilirubin: 0.8 mg/dL (ref <1.2)
Total Protein: 7.6 g/dL (ref 6.5–8.1)

## 2023-07-27 LAB — CBC WITH DIFFERENTIAL (CANCER CENTER ONLY)
Abs Immature Granulocytes: 0.03 10*3/uL (ref 0.00–0.07)
Basophils Absolute: 0.1 10*3/uL (ref 0.0–0.1)
Basophils Relative: 1 %
Eosinophils Absolute: 0.2 10*3/uL (ref 0.0–0.5)
Eosinophils Relative: 3 %
HCT: 40.2 % (ref 36.0–46.0)
Hemoglobin: 13.4 g/dL (ref 12.0–15.0)
Immature Granulocytes: 0 %
Lymphocytes Relative: 20 %
Lymphs Abs: 1.8 10*3/uL (ref 0.7–4.0)
MCH: 30.5 pg (ref 26.0–34.0)
MCHC: 33.3 g/dL (ref 30.0–36.0)
MCV: 91.6 fL (ref 80.0–100.0)
Monocytes Absolute: 0.5 10*3/uL (ref 0.1–1.0)
Monocytes Relative: 6 %
Neutro Abs: 6.2 10*3/uL (ref 1.7–7.7)
Neutrophils Relative %: 70 %
Platelet Count: 237 10*3/uL (ref 150–400)
RBC: 4.39 MIL/uL (ref 3.87–5.11)
RDW: 12.7 % (ref 11.5–15.5)
WBC Count: 8.9 10*3/uL (ref 4.0–10.5)
nRBC: 0 % (ref 0.0–0.2)

## 2023-07-27 LAB — LACTATE DEHYDROGENASE: LDH: 155 U/L (ref 98–192)

## 2023-07-29 LAB — BETA 2 MICROGLOBULIN, SERUM: Beta-2 Microglobulin: 1.9 mg/L (ref 0.6–2.4)

## 2023-07-30 LAB — KAPPA/LAMBDA LIGHT CHAINS
Kappa free light chain: 30.8 mg/L — ABNORMAL HIGH (ref 3.3–19.4)
Kappa, lambda light chain ratio: 4 — ABNORMAL HIGH (ref 0.26–1.65)
Lambda free light chains: 7.7 mg/L (ref 5.7–26.3)

## 2023-07-31 ENCOUNTER — Ambulatory Visit: Payer: PPO | Admitting: Physician Assistant

## 2023-07-31 DIAGNOSIS — D472 Monoclonal gammopathy: Secondary | ICD-10-CM | POA: Diagnosis not present

## 2023-08-02 DIAGNOSIS — D472 Monoclonal gammopathy: Secondary | ICD-10-CM | POA: Insufficient documentation

## 2023-08-02 LAB — MULTIPLE MYELOMA PANEL, SERUM
Albumin SerPl Elph-Mcnc: 4.1 g/dL (ref 2.9–4.4)
Albumin/Glob SerPl: 1.4 (ref 0.7–1.7)
Alpha 1: 0.3 g/dL (ref 0.0–0.4)
Alpha2 Glob SerPl Elph-Mcnc: 0.6 g/dL (ref 0.4–1.0)
B-Globulin SerPl Elph-Mcnc: 0.8 g/dL (ref 0.7–1.3)
Gamma Glob SerPl Elph-Mcnc: 1.4 g/dL (ref 0.4–1.8)
Globulin, Total: 3.1 g/dL (ref 2.2–3.9)
IgA: 63 mg/dL — ABNORMAL LOW (ref 87–352)
IgG (Immunoglobin G), Serum: 1563 mg/dL (ref 586–1602)
IgM (Immunoglobulin M), Srm: 82 mg/dL (ref 26–217)
M Protein SerPl Elph-Mcnc: 0.8 g/dL — ABNORMAL HIGH
Total Protein ELP: 7.2 g/dL (ref 6.0–8.5)

## 2023-08-02 NOTE — Assessment & Plan Note (Addendum)
IgG kappa MGUS diagnosed in 2011.  She presented with a 0.7 g/dL M spike and no evidence of symptomatic multiple myeloma.  July 19, 2022 showed very minimal M spike of 0.6 g/dL and normal IgG level. He has no evidence of endorgan damage with normal CBC and chemistries. She has slight elevation in her kappa to lambda ratio which is not dramatically changed.

## 2023-08-02 NOTE — Progress Notes (Unsigned)
Patient Care Team: [facility] as PCP - General (Internal Medicine) Gillian Scarce, MD as Consulting Physician (Family Medicine)  Clinic Day:  08/05/2023  Referring physician: Lemar Livings Me*  ASSESSMENT & PLAN:   Assessment & Plan: MGUS (monoclonal gammopathy of unknown significance) IgG kappa MGUS diagnosed in 2011.  She presented with a 0.7 g/dL M spike and no evidence of symptomatic multiple myeloma.  July 19, 2022 showed very minimal M spike of 0.6 g/dL and normal IgG level. He has no evidence of endorgan damage with normal CBC and chemistries. She has slight elevation in her kappa to lambda ratio which is not dramatically changed.  08/03/2023, labs ae stable in general - Light chains positive in urine. Kappa light chains down from 36.7-30.8.  Lambda light chain 7.7; kappa lambda ratio 4.00 IgA down from 67, and now 63. M protein has increased from 0.6, now 0.8. Plan to repeat labs and follow-up in 6 months.   Plan: Labs reviewed  -CBC showing WBC 8.9; Hgb 15.4; Hct 40.2; Plt 237; Anc 6.2 -CMP - K 4.2; glucose 84; BUN 16; Creatinine 0.83; eGFR >60; Ca 9.4; LFTs normal.   -Light chains positive in urine. -Kappa light chains down from 36.7-30.8.  Lambda light chain 7.7; kappa lambda ratio 4.00 -IgA down from 67, and now 63. -M protein has increased from 0.6, now 0.8. 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 Repeat labs and follow-up in 6 months.  Labs to be checked 1 to 2 weeks prior to scheduled follow-up.   The patient understands the plans discussed today and is in agreement with them.  She knows to contact our office if she develops concerns prior to her next appointment.  I provided 25 minutes of face-to-face time during this encounter and > 50% was spent counseling as documented under my assessment and plan.    Carlean Jews, NP  [facility] - [facility] DEPT OF [facility] [address] [place] [address] Dept: [phone] Dept Fax: [phone]   No orders of the defined types were placed in this encounter.     CHIEF COMPLAINT:  CC: MGUS  Current Treatment:  annual surveillance  INTERVAL HISTORY:  Brianna Greene is here today for repeat clinical assessment. Last saw Dr. Clelia Croft 07/26/2022. She has been clinically stable this past year.  She mentions she is on Prolia every 6 months due to osteoporosis.  Taking Sinemet for Parkinson's which is generally well-managed.  She denies fevers or chills. She denies pain. Her appetite is good. Her weight has been stable. She denies chest pain, chest pressure, or shortness of breath. She denies headaches or visual disturbances. she denies abdominal pain, nausea, vomiting, or changes in bowel or bladder habits.    I have reviewed the past medical history, past surgical history, social history and family history with the patient and they are unchanged from previous note.  ALLERGIES:  has No Known Allergies.  MEDICATIONS:  Current Outpatient Medications  Medication Sig Dispense Refill   Calcium 250 MG CAPS Take 3 tablets by mouth daily.     carbidopa-levodopa (SINEMET IR) 25-100 MG tablet Take 1 tablet by mouth 3 (three) times daily.     Cholecalciferol (VITAMIN D) 2000 units CAPS Take 1 capsule by mouth daily.     Cyanocobalamin 5000 MCG/ML LIQD Place under the tongue.     denosumab (PROLIA) 60 MG/ML SOSY injection Inject 60 mg into the  skin every 6 (six) months.     levothyroxine (SYNTHROID) 88 MCG tablet Take 88 mcg by mouth daily before breakfast.     pyridOXINE (VITAMIN B6) 100 MG tablet Take 100 mg by mouth daily.     No current facility-administered medications for this visit.      REVIEW OF SYSTEMS:   Constitutional: Denies fevers, chills or abnormal weight loss. Increased fatigue Eyes: Denies blurriness of vision Ears, nose, mouth, throat, and face: Denies mucositis or sore  throat Respiratory: Denies cough, dyspnea or wheezes Cardiovascular: Denies palpitation, chest discomfort or lower extremity swelling Gastrointestinal:  Denies nausea, heartburn or change in bowel habits Skin: Denies abnormal skin rashes Lymphatics: Denies new lymphadenopathy or easy bruising Neurological:Denies numbness, tingling or new weaknesses Behavioral/Psych: Mood is stable, no new changes  All other systems were reviewed with the patient and are negative.   VITALS:   Today's Vitals   08/03/23 1012  BP: (!) 117/58  Pulse: 61  Resp: 12  Temp: 98.3 F (36.8 C)  TempSrc: Temporal  SpO2: 100%  Weight: 124 lb (56.2 kg)  Height: 5' 2.25" (1.581 m)   Body mass index is 22.5 kg/m.   Wt Readings from Last 3 Encounters:  08/03/23 124 lb (56.2 kg)  07/26/22 124 lb 8 oz (56.5 kg)  07/20/21 122 lb 9.6 oz (55.6 kg)    Body mass index is 22.5 kg/m.  Performance status (ECOG): 0 - Asymptomatic  PHYSICAL EXAM:   GENERAL:alert, no distress and comfortable SKIN: skin color, texture, turgor are normal, no rashes or significant lesions EYES: normal, Conjunctiva are pink and non-injected, sclera clear OROPHARYNX:no exudate, no erythema and lips, buccal mucosa, and tongue normal  NECK: supple, thyroid normal size, non-tender, without nodularity LYMPH:  no palpable lymphadenopathy in the cervical, axillary or inguinal LUNGS: clear to auscultation and percussion with normal breathing effort HEART: regular rate & rhythm and no murmurs and no lower extremity edema ABDOMEN:abdomen soft, non-tender and normal bowel sounds Musculoskeletal:no cyanosis of digits and no clubbing  NEURO: alert & oriented x 3 with fluent speech, no focal motor/sensory deficits  LABORATORY DATA:  I have reviewed the data as listed    Component Value Date/Time   NA 138 07/27/2023 0957   K 4.2 07/27/2023 0957   CL 106 07/27/2023 0957   CO2 29 07/27/2023 0957   GLUCOSE 84 07/27/2023 0957   BUN 16  07/27/2023 0957   CREATININE 0.83 07/27/2023 0957   CALCIUM 9.4 07/27/2023 0957   PROT 7.6 07/27/2023 0957   ALBUMIN 4.4 07/27/2023 0957   AST 28 07/27/2023 0957   ALT 5 07/27/2023 0957   ALKPHOS 84 07/27/2023 0957   BILITOT 0.8 07/27/2023 0957   GFRNONAA >60 07/27/2023 0957   GFRAA >90 11/10/2014 0830    Lab Results  Component Value Date   WBC 8.9 07/27/2023   NEUTROABS 6.2 07/27/2023   HGB 13.4 07/27/2023   HCT 40.2 07/27/2023   MCV 91.6 07/27/2023   PLT 237 07/27/2023

## 2023-08-03 ENCOUNTER — Inpatient Hospital Stay: Payer: PPO | Admitting: Nurse Practitioner

## 2023-08-03 VITALS — BP 117/58 | HR 61 | Temp 98.3°F | Resp 12 | Ht 62.25 in | Wt 124.0 lb

## 2023-08-03 DIAGNOSIS — D472 Monoclonal gammopathy: Secondary | ICD-10-CM | POA: Diagnosis not present

## 2023-08-03 LAB — UPEP/UIFE/LIGHT CHAINS/TP, 24-HR UR
% BETA, Urine: 0 %
ALPHA 1 URINE: 0 %
Albumin, U: 100 %
Alpha 2, Urine: 0 %
Free Kappa Lt Chains,Ur: 1.15 mg/L — ABNORMAL LOW (ref 1.17–86.46)
Free Kappa/Lambda Ratio: >1.67 — ABNORMAL LOW (ref 1.83–14.26)
Free Lambda Lt Chains,Ur: <0.69 mg/L (ref 0.27–15.21)
GAMMA GLOBULIN URINE: 0 %
Total Protein, Urine-Ur/day: 87 mg/(24.h) (ref 30–150)
Total Protein, Urine: 6 mg/dL
Total Volume: 1450

## 2023-08-05 ENCOUNTER — Encounter: Payer: Self-pay | Admitting: Nurse Practitioner

## 2023-08-08 ENCOUNTER — Encounter: Payer: Self-pay | Admitting: Nurse Practitioner

## 2023-08-22 NOTE — Telephone Encounter (Signed)
Brianna Greene.  Will you change the patient's follow up appointment to 07/2024. I would still have her do the currently scheduled labs, but also schedule labs with new follow up appointment. Thanks  Herbert Seta, NP

## 2023-08-27 ENCOUNTER — Telehealth: Payer: Self-pay | Admitting: Hematology and Oncology

## 2024-01-16 ENCOUNTER — Other Ambulatory Visit: Payer: PPO

## 2024-01-23 ENCOUNTER — Ambulatory Visit: Payer: PPO | Admitting: Nurse Practitioner

## 2024-07-28 ENCOUNTER — Inpatient Hospital Stay: Payer: PPO | Attending: Hematology and Oncology

## 2024-07-28 ENCOUNTER — Other Ambulatory Visit: Payer: Self-pay | Admitting: Hematology and Oncology

## 2024-07-28 DIAGNOSIS — D472 Monoclonal gammopathy: Secondary | ICD-10-CM | POA: Diagnosis present

## 2024-07-28 DIAGNOSIS — G20A1 Parkinson's disease without dyskinesia, without mention of fluctuations: Secondary | ICD-10-CM | POA: Diagnosis not present

## 2024-07-28 DIAGNOSIS — M810 Age-related osteoporosis without current pathological fracture: Secondary | ICD-10-CM | POA: Insufficient documentation

## 2024-07-28 DIAGNOSIS — Z79899 Other long term (current) drug therapy: Secondary | ICD-10-CM | POA: Diagnosis not present

## 2024-07-28 LAB — CBC WITH DIFFERENTIAL (CANCER CENTER ONLY)
Abs Immature Granulocytes: 0.03 K/uL (ref 0.00–0.07)
Basophils Absolute: 0.1 K/uL (ref 0.0–0.1)
Basophils Relative: 1 %
Eosinophils Absolute: 0.3 K/uL (ref 0.0–0.5)
Eosinophils Relative: 3 %
HCT: 41.1 % (ref 36.0–46.0)
Hemoglobin: 14 g/dL (ref 12.0–15.0)
Immature Granulocytes: 0 %
Lymphocytes Relative: 33 %
Lymphs Abs: 2.8 K/uL (ref 0.7–4.0)
MCH: 31 pg (ref 26.0–34.0)
MCHC: 34.1 g/dL (ref 30.0–36.0)
MCV: 91.1 fL (ref 80.0–100.0)
Monocytes Absolute: 0.5 K/uL (ref 0.1–1.0)
Monocytes Relative: 6 %
Neutro Abs: 4.8 K/uL (ref 1.7–7.7)
Neutrophils Relative %: 57 %
Platelet Count: 238 K/uL (ref 150–400)
RBC: 4.51 MIL/uL (ref 3.87–5.11)
RDW: 13 % (ref 11.5–15.5)
WBC Count: 8.5 K/uL (ref 4.0–10.5)
nRBC: 0 % (ref 0.0–0.2)

## 2024-07-28 LAB — CMP (CANCER CENTER ONLY)
ALT: 5 U/L (ref 0–44)
AST: 19 U/L (ref 15–41)
Albumin: 4.6 g/dL (ref 3.5–5.0)
Alkaline Phosphatase: 72 U/L (ref 38–126)
Anion gap: 7 (ref 5–15)
BUN: 19 mg/dL (ref 8–23)
CO2: 27 mmol/L (ref 22–32)
Calcium: 9.6 mg/dL (ref 8.9–10.3)
Chloride: 104 mmol/L (ref 98–111)
Creatinine: 0.89 mg/dL (ref 0.44–1.00)
GFR, Estimated: >60 mL/min (ref >60)
Glucose, Bld: 80 mg/dL (ref 70–99)
Potassium: 3.8 mmol/L (ref 3.5–5.1)
Sodium: 138 mmol/L (ref 135–145)
Total Bilirubin: 0.8 mg/dL (ref 0.0–1.2)
Total Protein: 7.7 g/dL (ref 6.5–8.1)

## 2024-07-28 LAB — LACTATE DEHYDROGENASE: LDH: 135 U/L (ref 105–235)

## 2024-07-29 LAB — KAPPA/LAMBDA LIGHT CHAINS
Kappa free light chain: 30 mg/L — ABNORMAL HIGH (ref 3.3–19.4)
Kappa, lambda light chain ratio: 2.73 — ABNORMAL HIGH (ref 0.26–1.65)
Lambda free light chains: 11 mg/L (ref 5.7–26.3)

## 2024-07-31 LAB — MULTIPLE MYELOMA PANEL, SERUM
Albumin SerPl Elph-Mcnc: 4.2 g/dL (ref 2.9–4.4)
Albumin/Glob SerPl: 1.3 (ref 0.7–1.7)
Alpha 1: 0.3 g/dL (ref 0.0–0.4)
Alpha2 Glob SerPl Elph-Mcnc: 0.7 g/dL (ref 0.4–1.0)
B-Globulin SerPl Elph-Mcnc: 0.8 g/dL (ref 0.7–1.3)
Gamma Glob SerPl Elph-Mcnc: 1.6 g/dL (ref 0.4–1.8)
Globulin, Total: 3.4 g/dL (ref 2.2–3.9)
IgA: 72 mg/dL — ABNORMAL LOW (ref 87–352)
IgG (Immunoglobin G), Serum: 1823 mg/dL — ABNORMAL HIGH (ref 586–1602)
IgM (Immunoglobulin M), Srm: 87 mg/dL (ref 26–217)
M Protein SerPl Elph-Mcnc: 0.9 g/dL — ABNORMAL HIGH
Total Protein ELP: 7.6 g/dL (ref 6.0–8.5)

## 2024-08-03 NOTE — Progress Notes (Unsigned)
 [facility] [phone]   Fax:([phone]  PROGRESS NOTE  Patient Care Team: [facility] as PCP - General (Internal Medicine) Alvie Catheryn POUR, MD as Consulting Physician (Family Medicine)  Hematological/Oncological History # IgG Kappa MGUS  2011: diagnosed. M protein 0.7, no evidence of progression to MM 08/03/2024: establish care with Dr. Federico   Interval History:  Brianna Greene 68 y.o. female with medical history significant for MGUS who presents for a follow up visit. The patient's last visit was on 08/03/2023. In the interim since the last visit she has had no major changes in her health.  She reports that she is not having any bone pain or back pain.  She reports she does have Parkinson's and does get sore with too much physical activity.  She reports that her appetite has been good and that she has had no issues with low energy.  She reports that she would be interested in getting a bone scan.  She notes that she has never had a bone marrow biopsy and would prefer to defer having 1 at this time (though she does qualify for 1).  Otherwise she denies any changes in her urine such as bubbling, foaming, or change in the color of the urine.  Her weight remains steady and she is otherwise at her baseline level of health.  A full 10 point ROS is otherwise negative.  MEDICAL HISTORY:  Past Medical History:  Diagnosis Date   Abnormal Pap smear of cervix    early 20's   Hepatitis C    Hydatidiform mole    Hydatidiform mole    D&C done (age 32)   Hypothyroidism    Lichen sclerosus    Osteoporosis    Otalgia, unspecified    Pain in joint, ankle and foot     SURGICAL HISTORY: Past Surgical History:  Procedure Laterality Date   COLONOSCOPY  1/09   polyp removed   CRYOTHERAPY     ? for abnormal pap   CYSTECTOMY Right 1972   benign tumor removal rt arm   DILATION AND CURETTAGE OF UTERUS     EYE SURGERY     TONSILLECTOMY  1958    TUBAL LIGATION     age 55    SOCIAL HISTORY: Social History   Socioeconomic History   Marital status: Married    Spouse name: Bernett   Number of children: 3   Years of education: 14+   Highest education level: Not on file  Occupational History   Occupation: LACTATION CONSULTANT    Employer: [employer]  Tobacco Use   Smoking status: Former   Smokeless tobacco: Never  Substance and Sexual Activity   Alcohol use: Yes    Comment: 1-2 a month   Drug use: No   Sexual activity: Yes    Partners: Male    Birth control/protection: Surgical    Comment: BTL  Other Topics Concern   Not on file  Social History Narrative   Marital Status: Married Banker)   Children:  G3 P2/0/0/1   (1) Adopted   Pets: Cat   Living Situation: Lives with spouse   Occupation: Statistician; 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 : Never true  Transportation Needs: No Transportation Needs (08/20/2023)   Received from [facility]    In the past 12 months, has lack of reliable transportation kept you from medical appointments, meetings, work or from getting things needed for daily living? : No  Physical Activity: Not on file  Stress: Not on file  Social Connections: Not on file  Intimate Partner Violence: Not on file    FAMILY HISTORY: Family History  Problem  Relation Age of Onset   Ulcerative colitis Mother    Arthritis Mother    COPD Mother    Hyperlipidemia Mother    Osteoporosis Mother    Alcohol abuse Father    Liver disease Father    Diverticulitis Father    Heart disease Maternal Grandmother    Hypertension Maternal Grandmother     ALLERGIES:  has no known allergies.  MEDICATIONS:  Current Outpatient Medications  Medication Sig Dispense Refill   Calcium 250 MG CAPS Take 3 tablets by mouth daily.     carbidopa-levodopa (SINEMET IR) 25-100 MG tablet Take 1 tablet by mouth 3 (three) times daily.     Cholecalciferol (VITAMIN D ) 2000 units CAPS Take 1 capsule by mouth daily.     Cyanocobalamin 5000 MCG/ML LIQD Place under the tongue.     denosumab  (PROLIA ) 60 MG/ML SOSY injection Inject 60 mg into the skin every 6 (six) months.     levothyroxine (SYNTHROID) 88 MCG tablet Take 88 mcg by mouth daily before breakfast.     No current facility-administered medications for this visit.    REVIEW OF SYSTEMS:   Constitutional: ( - ) fevers, ( - )  chills , ( - ) night sweats Eyes: ( - ) blurriness of vision, ( - ) double vision, ( - ) watery eyes Ears, nose, mouth, throat, and face: ( - ) mucositis, ( - ) sore throat Respiratory: ( - ) cough, ( - ) dyspnea, ( - ) wheezes Cardiovascular: ( - ) palpitation, ( - ) chest discomfort, ( - ) lower extremity swelling Gastrointestinal:  ( - ) nausea, ( - ) heartburn, ( - ) change in bowel habits Skin: ( - ) abnormal skin rashes Lymphatics: ( - ) new lymphadenopathy, ( - ) easy bruising Neurological: ( - ) numbness, ( - ) tingling, ( - ) new weaknesses Behavioral/Psych: ( - ) mood change, ( - ) new changes  All other systems were reviewed with the patient and are negative.  PHYSICAL EXAMINATION: Vitals:   08/04/24 1026  BP: 137/82  Pulse: 60  Resp: 13  Temp: 98.1 F (36.7 C)  SpO2: 100%   Filed Weights   08/04/24 1026  Weight: 123 lb (55.8 kg)    GENERAL: Well-appearing elderly  Caucasian female, alert, no distress and comfortable SKIN: skin color, texture, turgor are normal, no rashes or significant lesions EYES: conjunctiva are pink and non-injected, sclera clear LUNGS: clear to auscultation and percussion with normal breathing effort HEART: regular rate & rhythm and no murmurs and no lower  extremity edema Musculoskeletal: no cyanosis of digits and no clubbing  PSYCH: alert & oriented x 3, fluent speech NEURO: no focal motor/sensory deficits  LABORATORY DATA:  I have reviewed the data as listed    Latest Ref Rng & Units 07/28/2024    9:53 AM 07/27/2023    9:57 AM 07/19/2022    9:08 AM  CBC  WBC 4.0 - 10.5 K/uL 8.5  8.9  6.3   Hemoglobin 12.0 - 15.0 g/dL 85.9  86.5  86.6   Hematocrit 36.0 - 46.0 % 41.1  40.2  38.6   Platelets 150 - 400 K/uL 238  237  242        Latest Ref Rng & Units 07/28/2024    9:53 AM 07/27/2023    9:57 AM 07/19/2022    9:08 AM  CMP  Glucose 70 - 99 mg/dL 80  84  78   BUN 8 - 23 mg/dL 19  16  15    Creatinine 0.44 - 1.00 mg/dL 9.10  9.16  9.10   Sodium 135 - 145 mmol/L 138  138  137   Potassium 3.5 - 5.1 mmol/L 3.8  4.2  3.9   Chloride 98 - 111 mmol/L 104  106  104   CO2 22 - 32 mmol/L 27  29  29    Calcium 8.9 - 10.3 mg/dL 9.6  9.4  9.3   Total Protein 6.5 - 8.1 g/dL 7.7  7.6  7.4   Total Bilirubin 0.0 - 1.2 mg/dL 0.8  0.8  0.9   Alkaline Phos 38 - 126 U/L 72  84  73   AST 15 - 41 U/L 19  28  18    ALT 0 - 44 U/L 5  5  <5     Lab Results  Component Value Date   MPROTEIN 0.9 (H) 07/28/2024   MPROTEIN 0.8 (H) 07/27/2023   MPROTEIN 0.6 (H) 07/19/2022   Lab Results  Component Value Date   KPAFRELGTCHN 30.0 (H) 07/28/2024   KPAFRELGTCHN 30.8 (H) 07/27/2023   KPAFRELGTCHN 36.7 (H) 07/19/2022   LAMBDASER 11.0 07/28/2024   LAMBDASER 7.7 07/27/2023   LAMBDASER 9.9 07/19/2022   KAPLAMBRATIO 2.73 (H) 07/28/2024   KAPLAMBRATIO >1.67 (L) 07/31/2023   KAPLAMBRATIO 4.00 (H) 07/27/2023    RADIOGRAPHIC STUDIES: No results  found.  ASSESSMENT & PLAN Brianna Greene 68 y.o. female with medical history significant for MGUS who presents for a follow up visit.   Monoclonal Gammopathies are a group of medical conditions defined by the presence of a monoclonal protein (an M protein) in the blood or urine. Monoclonal gammopathies include monoclonal gammopathy of unknown significance (MGUS), Monoclonal gammopathies of renal or neurological significance,  smoldering multiple myeloma (SMM), multiple myeloma (MM), AL amyloidosis, and Waldenstrom macroglobulinemia. The goal of the initial workup is to determine which monoclonal gammopathy a patient has. The workup consists of evaluating protein in the serum (with serum protein electrophoresis (SPEP) and serum free light chains) , evaluating protein in the urine (UPEP), and evaluation of the skeleton (DG Bone Met Survey) to assure no lytic lesions. Baseline bloodwork includes CMP and CBC. If no CRAB criteria or high risk criteria are noted then the diagnosis is MGUS. MGUS must be followed with bloodwork periodically to assure it does not convert to multiple myeloma (occurs to approximately 1% of patients per year). If there are CRAB criteria or high risk features (such as elevated serum free light chain ratio (taking into account renal function), a non IgG M  protein, or M protein >1.5) then a bone marrow biopsy must be pursued.    #IgG Kappa Monoclonal Gammopathy of Undetermined Significance --labs today show WBC 8.5, Hgb 14.0, MCV 91.1, Plt 238 --M protein 0.9 with Kappa 30, Lambda 11, Ratio 2.73 --recommend a metastatic bone survey to assess for lytic lesions --based on prior results patient does qualify for a bone marrow biopsy, though she would prefer to defer at this time.  I think this is reasonable as her labs have been steady over many years. --RTC in 12 months or sooner if intervention is required.    Orders Placed This Encounter  Procedures   DG Bone Survey Met     Standing Status:   Future    Expected Date:   08/11/2024    Expiration Date:   08/04/2025    Reason for Exam (SYMPTOM  OR DIAGNOSIS REQUIRED):   MGUS, assess for lytic lesions    Preferred imaging location?:   [facility]    All questions were answered. The patient knows to call the clinic with any problems, questions or concerns.  A total of more than 40 minutes were spent on this encounter with face-to-face time and non-face-to-face time, including preparing to see the patient, ordering tests and/or medications, counseling the patient and coordination of care as outlined above.   Brianna IVAR Kidney, MD Department of Hematology/Oncology [facility] Phone: [phone] Pager: [phone] Email: [email or web address]  08/05/2024 3:50 PM

## 2024-08-04 ENCOUNTER — Inpatient Hospital Stay: Payer: PPO | Admitting: Hematology and Oncology

## 2024-08-04 VITALS — BP 137/82 | HR 60 | Temp 98.1°F | Resp 13 | Wt 123.0 lb

## 2024-08-04 DIAGNOSIS — D472 Monoclonal gammopathy: Secondary | ICD-10-CM

## 2024-08-14 ENCOUNTER — Inpatient Hospital Stay (HOSPITAL_COMMUNITY): Admission: RE | Admit: 2024-08-14 | Discharge: 2024-08-14 | Attending: Hematology and Oncology

## 2024-08-14 DIAGNOSIS — D472 Monoclonal gammopathy: Secondary | ICD-10-CM | POA: Diagnosis present

## 2024-08-15 ENCOUNTER — Other Ambulatory Visit (HOSPITAL_COMMUNITY)

## 2025-08-10 ENCOUNTER — Inpatient Hospital Stay

## 2025-08-17 ENCOUNTER — Inpatient Hospital Stay: Admitting: Hematology and Oncology
